# Patient Record
Sex: Female | Born: 1990 | Race: White | Hispanic: No | Marital: Single | Smoking: Current every day smoker
Health system: Southern US, Community
[De-identification: ages and names within clinical notes are randomized; demographics above are authoritative.]

## PROBLEM LIST (undated history)

## (undated) DIAGNOSIS — R569 Unspecified convulsions: Secondary | ICD-10-CM

## (undated) HISTORY — PX: WISDOM TOOTH EXTRACTION: SHX21

## (undated) HISTORY — PX: ADENOIDECTOMY: SUR15

## (undated) HISTORY — DX: Unspecified convulsions: R56.9

---
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
## 2011-02-13 LAB — CBC
HCT: 32.1 % — ABNORMAL LOW (ref 36.0–46.0)
Hemoglobin: 11.2 g/dL — ABNORMAL LOW (ref 12.0–15.0)
MCHC: 34.9 g/dL (ref 30.0–36.0)
MCHC: 35.4 g/dL (ref 30.0–36.0)
MCV: 94.1 fL (ref 78.0–100.0)
Platelets: 115 10*3/uL — ABNORMAL LOW (ref 150–400)
RDW: 12.6 % (ref 11.5–15.5)
RDW: 13 % (ref 11.5–15.5)

## 2011-02-13 LAB — RH IMMUNE GLOB WKUP(>/=20WKS)(NOT WOMEN'S HOSP)

## 2015-01-04 ENCOUNTER — Other Ambulatory Visit: Payer: Self-pay | Admitting: Advanced Practice Midwife

## 2016-03-02 ENCOUNTER — Other Ambulatory Visit: Payer: Self-pay | Admitting: Advanced Practice Midwife

## 2016-05-08 ENCOUNTER — Encounter (HOSPITAL_COMMUNITY): Payer: Self-pay | Admitting: Emergency Medicine

## 2016-05-08 ENCOUNTER — Emergency Department (HOSPITAL_COMMUNITY)
Admission: EM | Admit: 2016-05-08 | Discharge: 2016-05-08 | Disposition: A | Discharge status: Home / Self Care | Payer: Medicaid Other | Attending: Emergency Medicine | Admitting: Emergency Medicine

## 2016-05-08 DIAGNOSIS — K047 Periapical abscess without sinus: Secondary | ICD-10-CM | POA: Diagnosis not present

## 2016-05-08 DIAGNOSIS — F17200 Nicotine dependence, unspecified, uncomplicated: Secondary | ICD-10-CM | POA: Diagnosis not present

## 2016-05-08 DIAGNOSIS — Z79899 Other long term (current) drug therapy: Secondary | ICD-10-CM | POA: Diagnosis not present

## 2016-05-08 MED ORDER — HYDROCODONE-ACETAMINOPHEN 5-325 MG PO TABS
1.0000 | ORAL_TABLET | Freq: Once | ORAL | Status: AC
  Administered 2016-05-08: 1 via ORAL
  Filled 2016-05-08: qty 1

## 2016-05-08 MED ORDER — CLINDAMYCIN HCL 150 MG PO CAPS
300.0000 mg | ORAL_CAPSULE | Freq: Once | ORAL | Status: AC
  Administered 2016-05-08: 300 mg via ORAL
  Filled 2016-05-08: qty 2

## 2016-05-08 MED ORDER — CLINDAMYCIN HCL 300 MG PO CAPS: 300.0000 mg | ORAL_CAPSULE | Freq: Four times a day (QID) | ORAL | Status: DC

## 2016-05-08 MED ORDER — HYDROCODONE-ACETAMINOPHEN 5-325 MG PO TABS: ORAL_TABLET | ORAL | Status: DC

## 2016-05-08 NOTE — ED Notes (Signed)
Pt verbalized understanding of no driving and to use caution within 4 hours of taking pain meds due to meds cause drowsiness 

## 2016-05-08 NOTE — ED Notes (Addendum)
Patient complaining of lower left dental pain since May, worsening 1 1/2 weeks ago. States "I'm on my 4th round of augmentin and it's not helping." States she took 2 tramadol and 3 ibuprofen approximately 2 hours ago with no relief.

## 2016-05-08 NOTE — ED Provider Notes (Signed)
CSN: [id number]     Arrival date & time 05/08/16  1345 History   First MD Initiated Contact with Patient 05/08/16 1419     Chief Complaint  Patient presents with  . Dental Pain     (Consider location/radiation/quality/duration/timing/severity/associated sxs/prior Treatment) HPI   Allison Sandoval is a 25 y.o. female who presents to the Emergency Department complaining of dental pain and left facial swelling. Symptoms have been present for one month.  She has seen her dentist and has been taking augmentin without improvement.  Pain is worse with chewing and improves with application of a cold compress.  She denies fever, neck pain, difficulty swallowing.    History reviewed. No pertinent past medical history. History reviewed. No pertinent past surgical history. History reviewed. No pertinent family history. Social History  Substance Use Topics  . Smoking status: Current Every Day Smoker  . Smokeless tobacco: None  . Alcohol Use: No   OB History    No data available     Review of Systems  Constitutional: Negative for fever and appetite change.  HENT: Positive for dental problem. Negative for congestion, facial swelling, sore throat and trouble swallowing.   Eyes: Negative for pain and visual disturbance.  Musculoskeletal: Negative for neck pain and neck stiffness.  Neurological: Negative for dizziness, facial asymmetry and headaches.  Hematological: Negative for adenopathy.  All other systems reviewed and are negative.     Allergies  Review of patient's allergies indicates no known allergies.  Home Medications   Prior to Admission medications   Medication Sig Start Date End Date Taking? Authorizing Provider  amoxicillin-clavulanate (AUGMENTIN) 875-125 MG tablet Take 1 tablet by mouth 2 (two) times daily. Started 05/04/16 05/04/16  Yes Historical Provider, MD  ORSYTHIA 0.1-20 MG-MCG tablet Take 1 tablet by mouth daily. 03/30/16  Yes Historical Provider, MD   BP 116/79 mmHg   Pulse 103  Temp(Src) 98.9 F (37.2 C) (Oral)  Resp 14  Ht 5\' 3"  (1.6 m)  Wt 54.432 kg  BMI 21.26 kg/m2  SpO2 100%  LMP 04/29/2016 Physical Exam  Constitutional: She is oriented to person, place, and time. She appears well-developed and well-nourished. No distress.  HENT:  Head: Normocephalic and atraumatic.  Right Ear: Tympanic membrane and ear canal normal.  Left Ear: Tympanic membrane and ear canal normal.  Mouth/Throat: Uvula is midline, oropharynx is clear and moist and mucous membranes are normal. No trismus in the jaw. Dental caries present. No dental abscesses or uvula swelling.  Tenderness and dental caries of the left lower second molar.  Partial fracture of the tooth.  focal facial swelling of left jaw, No trismus, or sublingual abnml.    Neck: Normal range of motion. Neck supple.  Cardiovascular: Normal rate, regular rhythm and normal heart sounds.   No murmur heard. Pulmonary/Chest: Effort normal and breath sounds normal.  Musculoskeletal: Normal range of motion.  Lymphadenopathy:    She has no cervical adenopathy.  Neurological: She is alert and oriented to person, place, and time. She exhibits normal muscle tone. Coordination normal.  Skin: Skin is warm and dry.  Nursing note and vitals reviewed.   ED Course  Procedures (including critical care time) Labs Review Labs Reviewed - No data to display  Imaging Review No results found. I have personally reviewed and evaluated these images and lab results as part of my medical decision-making.   EKG Interpretation None      MDM   Final diagnoses:  Dental abscess    Patient  well appearing.  Non-toxic.  Localized left lower facial swelling w/o fluctuance of the gingiva.  She has f/u dental appt later this month.  No concerning sx's for ludwig's angina.  Appears stable for d/c    Pauline Ausammy Reece Fehnel, PA-C 05/09/16 2224  Zadie Rhineonald Wickline, MD 05/10/16 [phone]

## 2016-05-08 NOTE — Discharge Instructions (Signed)
Dental Abscess A dental abscess is pus in or around a tooth. HOME CARE  Take medicines only as told by your dentist.  If you were prescribed antibiotic medicine, finish all of it even if you start to feel better.  Rinse your mouth (gargle) often with salt water.  Do not drive or use heavy machinery, like a lawn mower, while taking pain medicine.  Do not apply heat to the outside of your mouth.  Keep all follow-up visits as told by your dentist. This is important. GET HELP IF:  Your pain is worse, and medicine does not help. GET HELP RIGHT AWAY IF:  You have a fever or chills.  Your symptoms suddenly get worse.  You have a very bad headache.  You have problems breathing or swallowing.  You have trouble opening your mouth.  You have puffiness (swelling) in your neck or around your eye.   This information is not intended to replace advice given to you by your health care provider. Make sure you discuss any questions you have with your health care provider.   Document Released: 03/07/2015 Document Reviewed: 03/07/2015 Elsevier Interactive Patient Education 2016 [employer]  

## 2016-10-18 ENCOUNTER — Emergency Department (HOSPITAL_COMMUNITY)
Admission: EM | Admit: 2016-10-18 | Discharge: 2016-10-18 | Disposition: A | Discharge status: Home / Self Care | Payer: Medicaid Other | Attending: Emergency Medicine | Admitting: Emergency Medicine

## 2016-10-18 ENCOUNTER — Emergency Department (HOSPITAL_COMMUNITY): Payer: Medicaid Other

## 2016-10-18 ENCOUNTER — Encounter (HOSPITAL_COMMUNITY): Payer: Self-pay | Admitting: Emergency Medicine

## 2016-10-18 DIAGNOSIS — Y929 Unspecified place or not applicable: Secondary | ICD-10-CM | POA: Insufficient documentation

## 2016-10-18 DIAGNOSIS — S50312A Abrasion of left elbow, initial encounter: Secondary | ICD-10-CM | POA: Diagnosis not present

## 2016-10-18 DIAGNOSIS — S0990XA Unspecified injury of head, initial encounter: Secondary | ICD-10-CM | POA: Diagnosis present

## 2016-10-18 DIAGNOSIS — S50311A Abrasion of right elbow, initial encounter: Secondary | ICD-10-CM | POA: Insufficient documentation

## 2016-10-18 DIAGNOSIS — Y999 Unspecified external cause status: Secondary | ICD-10-CM | POA: Diagnosis not present

## 2016-10-18 DIAGNOSIS — R51 Headache: Secondary | ICD-10-CM

## 2016-10-18 DIAGNOSIS — R519 Headache, unspecified: Secondary | ICD-10-CM

## 2016-10-18 DIAGNOSIS — S80211A Abrasion, right knee, initial encounter: Secondary | ICD-10-CM | POA: Insufficient documentation

## 2016-10-18 DIAGNOSIS — S80212A Abrasion, left knee, initial encounter: Secondary | ICD-10-CM | POA: Diagnosis not present

## 2016-10-18 DIAGNOSIS — W1830XA Fall on same level, unspecified, initial encounter: Secondary | ICD-10-CM | POA: Insufficient documentation

## 2016-10-18 DIAGNOSIS — R569 Unspecified convulsions: Secondary | ICD-10-CM | POA: Diagnosis not present

## 2016-10-18 DIAGNOSIS — Y939 Activity, unspecified: Secondary | ICD-10-CM | POA: Insufficient documentation

## 2016-10-18 DIAGNOSIS — S0081XA Abrasion of other part of head, initial encounter: Secondary | ICD-10-CM | POA: Diagnosis not present

## 2016-10-18 LAB — CBC
HEMATOCRIT: 43.6 % (ref 36.0–46.0)
HEMOGLOBIN: 14.9 g/dL (ref 12.0–15.0)
MCH: 32 pg (ref 26.0–34.0)
MCHC: 34.2 g/dL (ref 30.0–36.0)
MCV: 93.6 fL (ref 78.0–100.0)
Platelets: 175 10*3/uL (ref 150–400)
RBC: 4.66 MIL/uL (ref 3.87–5.11)
RDW: 12.8 % (ref 11.5–15.5)
WBC: 10.4 10*3/uL (ref 4.0–10.5)

## 2016-10-18 LAB — BASIC METABOLIC PANEL
ANION GAP: 9 (ref 5–15)
BUN: 9 mg/dL (ref 6–20)
CHLORIDE: 108 mmol/L (ref 101–111)
CO2: 22 mmol/L (ref 22–32)
Calcium: 9.5 mg/dL (ref 8.9–10.3)
Creatinine, Ser: 0.86 mg/dL (ref 0.44–1.00)
GFR calc non Af Amer: >60 mL/min (ref >60)
Glucose, Bld: 100 mg/dL — ABNORMAL HIGH (ref 65–99)
POTASSIUM: 3.8 mmol/L (ref 3.5–5.1)
Sodium: 139 mmol/L (ref 135–145)

## 2016-10-18 LAB — I-STAT BETA HCG BLOOD, ED (MC, WL, AP ONLY): I-stat hCG, quantitative: <5 m[IU]/mL (ref <5)

## 2016-10-18 MED ORDER — ACETAMINOPHEN 325 MG PO TABS
650.0000 mg | ORAL_TABLET | Freq: Once | ORAL | Status: AC
  Administered 2016-10-18: 650 mg via ORAL
  Filled 2016-10-18: qty 2

## 2016-10-18 NOTE — ED Triage Notes (Addendum)
Pt via [facility] with c/o new onset seizures.  Pt's friend witnessed pt take several steps before falling forward, landing on her chin and having two 30 second seizures with approx 15 seconds between them.  Pt was posictal on EMS arrival and confused.  Pt has abrasions and small cuts to left upper lip, chin, bilateral elbows, knuckles, and knees.  Pt does not have a hx of the same.  Pt reports not feeling well today with nausea but denies any other symptoms.  NAD, A&Ox4, ambulatory from EMS stretcher to room.

## 2016-10-18 NOTE — Discharge Instructions (Signed)
Your CT was normal today. I have given [facility] a [facility]. Please call for an appointment. I have also given a [facility] to establish primary care with. This is likely a new onset seizure. You need to follow-up with neurology. You may take Tylenol and ibuprofen for pain. Please do not drive given that this is a first seizure for you. Please return to the ED if he develop any worsening symptoms or develop another seizure or fever, or vision changes.

## 2016-10-18 NOTE — ED Provider Notes (Signed)
[facility] DEPT Provider Note   CSN: [id number] Arrival date & time: 10/18/16  4098     History   Chief Complaint Chief Complaint  Patient presents with  . Seizures    HPI Allison Sandoval is a 25 y.o. female.  25 year old Caucasian female with a past medical history with no significant past medical history presents to the ED today by EMS for new onset seizure witnessed by friend. Patient's friend witnessed patient takes several steps before falling and landing on her chin on concrete. States that she had to 2 30 sec episodes of "muscle tensing and shaking" with approximately 15 seconds between the 2 episodes. Friends states her eye were open during the event. Patient endorses loc. She does not remember the event. She did bite her tongue but denies any loss of bowel or bladder. Patient denies any history of seizures. EMS was called and patient was postictal on arrival confused. Patient states that she has not been feeling well today prior to the episode. States that she has had poor by mouth intake since yesterday morning. States that she is nauseated but has not vomited. Patient recently separated from her fianc and moved out of his house. She is living with her parents and her 28-year-old son. States that she has been on Xanax for anxiety but has not taking it for the past 5 days. Patient has been ambulatory since the event. She does have several cuts and abrasions noted to her chin, bilateral elbows, knuckles, knees. Patient's only complaint is of her right jaw being sore. Patient has history of TMJ but states her right jaw feels sore from the fall. Patient denies any fever, chills, headache, vision changes, lightheadedness, dizziness, chest pain, palpitations, shortness of breath, abdominal pain, nausea, emesis, urinary symptoms, change in bowel habits.      History reviewed. No pertinent past medical history.  There are no active problems to display for this patient.   Past  Surgical History:  Procedure Laterality Date  . ADENOIDECTOMY    . WISDOM TOOTH EXTRACTION      OB History    No data available       Home Medications    Prior to Admission medications   Medication Sig Start Date End Date Taking? Authorizing Provider  DiphenhydrAMINE HCl, Sleep, (SLEEP AID) 25 MG CAPS Take 1 capsule by mouth at bedtime.   Yes Historical Provider, MD  ORSYTHIA 0.1-20 MG-MCG tablet Take 1 tablet by mouth daily. 03/30/16  Yes Historical Provider, MD  clindamycin (CLEOCIN) 300 MG capsule Take 1 capsule (300 mg total) by mouth 4 (four) times daily. For 7 days Patient not taking: Reported on 10/18/2016 05/08/16   Pauline Aus, PA-C  HYDROcodone-acetaminophen (NORCO/VICODIN) 5-325 MG tablet Take one tab po q 4-6 hrs prn pain Patient not taking: Reported on 10/18/2016 05/08/16   Pauline Aus, PA-C    Family History History reviewed. No pertinent family history.  Social History Social History  Substance Use Topics  . Smoking status: Current Every Day Smoker    Packs/day: 1.00    Types: Cigarettes  . Smokeless tobacco: Never Used  . Alcohol use Yes     Comment: occasionally     Allergies   Patient has no known allergies.   Review of Systems Review of Systems  Constitutional: Positive for fatigue. Negative for chills and fever.  HENT: Negative for ear pain and sore throat.   Eyes: Negative for pain and visual disturbance.  Respiratory: Negative for cough and shortness of  breath.   Cardiovascular: Negative for chest pain and palpitations.  Gastrointestinal: Negative for abdominal pain and vomiting.  Genitourinary: Negative for dysuria and hematuria.  Musculoskeletal: Negative for arthralgias, back pain, neck pain and neck stiffness.  Skin: Positive for wound. Negative for color change and rash.  Neurological: Positive for seizures. Negative for dizziness, syncope, weakness, light-headedness, numbness and headaches.  All other systems reviewed and are  negative.    Physical Exam Updated Vital Signs BP 101/61   Pulse 90   Temp 98.4 F (36.9 C) (Oral)   Resp 12   Ht 5\' 3"  (1.6 m)   Wt 49.9 kg   LMP 10/04/2016 (Approximate)   SpO2 98%   BMI 19.49 kg/m   Physical Exam  Constitutional: She is oriented to person, place, and time. She appears well-developed and well-nourished. No distress.  Patient is awake and alert 4. She has mild confusion and what happened prior to the event. However she has normal memory after the event.  HENT:  Head: Normocephalic and atraumatic. Head is without raccoon's eyes, without Battle's sign and without contusion.  Right Ear: Tympanic membrane, external ear and ear canal normal.  Left Ear: Tympanic membrane, external ear and ear canal normal.  Nose: Nose normal.  Mouth/Throat: Uvula is midline, oropharynx is clear and moist and mucous membranes are normal.  Small abrasion noted to the right lower chin. Bleeding is controlled. Patient did bite her tongue. No signs of laceration. No active bleeding. Patient without any trismus. Her when she opens her jaw she feels pain on the condylar process. Patient states that her jaw always pops when she moves and this is normal for her.  Eyes: Conjunctivae and EOM are normal. Pupils are equal, round, and reactive to light. Right eye exhibits no discharge. Left eye exhibits no discharge. No scleral icterus.  Neck: Normal range of motion. Neck supple. No thyromegaly present.  Cardiovascular: Normal rate, regular rhythm, normal heart sounds and intact distal pulses.  Exam reveals no gallop and no friction rub.   No murmur heard. Pulmonary/Chest: Effort normal and breath sounds normal. No respiratory distress. She exhibits no tenderness.  No signs of contusions or ecchymosis.  Abdominal: Soft. Bowel sounds are normal. She exhibits no distension. There is no tenderness. There is no rebound and no guarding.  Musculoskeletal: Normal range of motion. She exhibits no tenderness  or deformity.  No paint with palpation of the bilateral knee or elbows. Patient with abrasions to her bilateral knees. Bleeding is controlled. She also has abrasions to her bilateral elbows. Bleeding is controlled. She has full range of motion of all extremities. No crepitus, deformity, ecchymosis, erythema noted to any joints. Her DP pulses are 2+ bilaterally. Radial pulses are 2+ bilaterally. Sensation intact. Cap refill normal throughout all extremities.  Lymphadenopathy:    She has no cervical adenopathy.  Neurological: She is alert and oriented to person, place, and time.  The patient is alert, attentive, and oriented x 3. Speech is clear. Cranial nerve II-VII grossly intact. Negative pronator drift. Sensation intact. Strength 5/5 in all extremities. Reflexes 2+ and symmetric at biceps, triceps, knees, and ankles. Rapid alternating movement and fine finger movements intact. Romberg is absent. Posture and gait normal.   Skin: Skin is warm and dry. Capillary refill takes less than 2 seconds.  Nursing note and vitals reviewed.    ED Treatments / Results  Labs (all labs ordered are listed, but only abnormal results are displayed) Labs Reviewed  BASIC METABOLIC PANEL -  Abnormal; Notable for the following:       Result Value   Glucose, Bld 100 (*)    All other components within normal limits  CBC  I-STAT BETA HCG BLOOD, ED ([facility], [facility], [facility] ONLY)  CBG MONITORING, ED    EKG  EKG Interpretation None       Radiology Ct Head Wo Contrast  Result Date: 10/18/2016 CLINICAL DATA:  Seizure with fall EXAM: CT HEAD WITHOUT CONTRAST CT MAXILLOFACIAL WITHOUT CONTRAST CT CERVICAL SPINE WITHOUT CONTRAST TECHNIQUE: Multidetector CT imaging of the head, cervical spine, and maxillofacial structures were performed using the standard protocol without intravenous contrast. Multiplanar CT image reconstructions of the cervical spine and maxillofacial structures were also generated. COMPARISON:  None.  FINDINGS: CT HEAD FINDINGS Brain: The ventricles are normal in size and configuration. There is no intracranial mass, hemorrhage, extra-axial fluid collection, or midline shift. Gray-white compartments are normal. No acute infarct evident. Vascular: No hyperdense vessel.  No evident vascular calcifications. Skull: Bony calvarium appears intact. Other: Mastoid air cells are clear. There is debris in each external auditory canal. CT MAXILLOFACIAL FINDINGS Osseous: There is no fracture or dislocation. No blastic or lytic bone lesions are evident. Orbits: Orbits appear symmetric bilaterally. No intraorbital lesions are evident. Sinuses: There is opacification in the posterior aspect of the left sphenoid sinus. Other paranasal sinuses are clear. The ostiomeatal unit complexes are patent bilaterally. There is no nares obstruction. The nasal septum appears midline. Soft tissues: No soft tissue mass or abscess. Salivary glands appear symmetric and normal bilaterally. No adenopathy. Visualized pharynx is normal. Tongue and tongue base regions appear normal. CT CERVICAL SPINE FINDINGS Alignment: There is no spondylolisthesis. Skull base and vertebrae: The craniocervical junction and skull base regions appear normal. There is no apparent fracture. No blastic or lytic bone lesions. Soft tissues and spinal canal: Prevertebral soft tissues and predental space regions are normal. No paraspinous lesions. No spinal stenosis. Disc levels: Disc spaces appear normal. No nerve root edema or effacement. No disc extrusion or stenosis. Upper chest: Visualized upper lobe regions appear normal. Other: None IMPRESSION: CT head: No intracranial mass, hemorrhage, or extra-axial fluid collection. Gray-white compartments are normal. Debris is noted in each external auditory canal. CT maxillofacial: Left sphenoid sinus disease. No fracture or dislocation. Study otherwise unremarkable. CT cervical spine: No fracture or spondylolisthesis. No evident  arthropathy. Electronically Signed   By: Bretta Bang III M.D.   On: 10/18/2016 11:39   Ct Cervical Spine Wo Contrast  Result Date: 10/18/2016 CLINICAL DATA:  Seizure with fall EXAM: CT HEAD WITHOUT CONTRAST CT MAXILLOFACIAL WITHOUT CONTRAST CT CERVICAL SPINE WITHOUT CONTRAST TECHNIQUE: Multidetector CT imaging of the head, cervical spine, and maxillofacial structures were performed using the standard protocol without intravenous contrast. Multiplanar CT image reconstructions of the cervical spine and maxillofacial structures were also generated. COMPARISON:  None. FINDINGS: CT HEAD FINDINGS Brain: The ventricles are normal in size and configuration. There is no intracranial mass, hemorrhage, extra-axial fluid collection, or midline shift. Gray-white compartments are normal. No acute infarct evident. Vascular: No hyperdense vessel.  No evident vascular calcifications. Skull: Bony calvarium appears intact. Other: Mastoid air cells are clear. There is debris in each external auditory canal. CT MAXILLOFACIAL FINDINGS Osseous: There is no fracture or dislocation. No blastic or lytic bone lesions are evident. Orbits: Orbits appear symmetric bilaterally. No intraorbital lesions are evident. Sinuses: There is opacification in the posterior aspect of the left sphenoid sinus. Other paranasal sinuses are clear. The ostiomeatal unit complexes  are patent bilaterally. There is no nares obstruction. The nasal septum appears midline. Soft tissues: No soft tissue mass or abscess. Salivary glands appear symmetric and normal bilaterally. No adenopathy. Visualized pharynx is normal. Tongue and tongue base regions appear normal. CT CERVICAL SPINE FINDINGS Alignment: There is no spondylolisthesis. Skull base and vertebrae: The craniocervical junction and skull base regions appear normal. There is no apparent fracture. No blastic or lytic bone lesions. Soft tissues and spinal canal: Prevertebral soft tissues and predental space  regions are normal. No paraspinous lesions. No spinal stenosis. Disc levels: Disc spaces appear normal. No nerve root edema or effacement. No disc extrusion or stenosis. Upper chest: Visualized upper lobe regions appear normal. Other: None IMPRESSION: CT head: No intracranial mass, hemorrhage, or extra-axial fluid collection. Gray-white compartments are normal. Debris is noted in each external auditory canal. CT maxillofacial: Left sphenoid sinus disease. No fracture or dislocation. Study otherwise unremarkable. CT cervical spine: No fracture or spondylolisthesis. No evident arthropathy. Electronically Signed   By: Bretta BangWilliam  Woodruff III M.D.   On: 10/18/2016 11:39   Ct Maxillofacial Wo Cm  Result Date: 10/18/2016 CLINICAL DATA:  Seizure with fall EXAM: CT HEAD WITHOUT CONTRAST CT MAXILLOFACIAL WITHOUT CONTRAST CT CERVICAL SPINE WITHOUT CONTRAST TECHNIQUE: Multidetector CT imaging of the head, cervical spine, and maxillofacial structures were performed using the standard protocol without intravenous contrast. Multiplanar CT image reconstructions of the cervical spine and maxillofacial structures were also generated. COMPARISON:  None. FINDINGS: CT HEAD FINDINGS Brain: The ventricles are normal in size and configuration. There is no intracranial mass, hemorrhage, extra-axial fluid collection, or midline shift. Gray-white compartments are normal. No acute infarct evident. Vascular: No hyperdense vessel.  No evident vascular calcifications. Skull: Bony calvarium appears intact. Other: Mastoid air cells are clear. There is debris in each external auditory canal. CT MAXILLOFACIAL FINDINGS Osseous: There is no fracture or dislocation. No blastic or lytic bone lesions are evident. Orbits: Orbits appear symmetric bilaterally. No intraorbital lesions are evident. Sinuses: There is opacification in the posterior aspect of the left sphenoid sinus. Other paranasal sinuses are clear. The ostiomeatal unit complexes are patent  bilaterally. There is no nares obstruction. The nasal septum appears midline. Soft tissues: No soft tissue mass or abscess. Salivary glands appear symmetric and normal bilaterally. No adenopathy. Visualized pharynx is normal. Tongue and tongue base regions appear normal. CT CERVICAL SPINE FINDINGS Alignment: There is no spondylolisthesis. Skull base and vertebrae: The craniocervical junction and skull base regions appear normal. There is no apparent fracture. No blastic or lytic bone lesions. Soft tissues and spinal canal: Prevertebral soft tissues and predental space regions are normal. No paraspinous lesions. No spinal stenosis. Disc levels: Disc spaces appear normal. No nerve root edema or effacement. No disc extrusion or stenosis. Upper chest: Visualized upper lobe regions appear normal. Other: None IMPRESSION: CT head: No intracranial mass, hemorrhage, or extra-axial fluid collection. Gray-white compartments are normal. Debris is noted in each external auditory canal. CT maxillofacial: Left sphenoid sinus disease. No fracture or dislocation. Study otherwise unremarkable. CT cervical spine: No fracture or spondylolisthesis. No evident arthropathy. Electronically Signed   By: Bretta BangWilliam  Woodruff III M.D.   On: 10/18/2016 11:39    Procedures Procedures (including critical care time)  Medications Ordered in ED Medications  acetaminophen (TYLENOL) tablet 650 mg (650 mg Oral Given 10/18/16 1151)     Initial Impression / Assessment and Plan / ED Course  I have reviewed the triage vital signs and the nursing notes.  Pertinent labs &  imaging results that were available during my care of the patient were reviewed by me and considered in my medical decision making (see chart for details).  Clinical Course   Patient with no evidence of focal neuro deficits on physical exam and is at mental baseline.  Labs and imaging have been reviewed. She has been monitored for 3 hours without any new seizure. Patient is  advised to followup with neurologist in regards to today's event.  Wounds were clean and dressed. No imagine required given no pain. Spoke with patient and family in detail about driving restrictions until cleared by a neurologist.  Patient verbalizes understanding. Answered all questions.  Patient is hemodynamically stable and in no acute distress prior to discharge. Patient felt safe for discharge. Mother is at bedside and states they will follow up with neurologist. She is able to ambulate without any abnormal gait. Patient discussed with Dr. Deretha Emory who agrees with the above plan.    Final Clinical Impressions(s) / ED Diagnoses   Final diagnoses:  Seizure-like activity (HCC)  Nonintractable headache, unspecified chronicity pattern, unspecified headache type    New Prescriptions New Prescriptions   No medications on file     Rise Mu, PA-C 10/18/16 1228    Vanetta Mulders, MD 10/19/16 2322

## 2016-10-23 ENCOUNTER — Ambulatory Visit (INDEPENDENT_AMBULATORY_CARE_PROVIDER_SITE_OTHER): Payer: Medicaid Other | Admitting: Neurology

## 2016-10-23 ENCOUNTER — Encounter: Payer: Self-pay | Admitting: Neurology

## 2016-10-23 DIAGNOSIS — R569 Unspecified convulsions: Secondary | ICD-10-CM | POA: Diagnosis not present

## 2016-10-23 NOTE — Patient Instructions (Signed)
No driving until seizure-free for 6 months 

## 2016-10-23 NOTE — Progress Notes (Signed)
PATIENT: Allison Sandoval DOB: January 09, 1991  Chief Complaint  Patient presents with  . Seizure-like event    She is here with her mother, Jenel LucksRoberta.  States a friend witnessed a seizure-like event on 10/18/16.  She fell to the ground with muscles tense and shaking.  No loss of bowel or bladder.  She had a bitten tongue and was nauseated afterwards.  The event lasted approximately 20 seconds.  She does not remember it happening and was taken to the ED for evaluation.    Marland Kitchen. PCP    No PCP -  referred by ED     HISTORICAL  Allison DuMegan L Fleig is a 25 years old right-handed female, seen in refer by emergency room for evaluation of seizure, initial evaluation was October 23 2016.  She has no previous history of seizure, on October 18 2016, around 8 AM, when she stepped out of her car, she felt lightheadedness, then lost consciousness on the ground, per witness, she fell on her left knee, bruised her chin, there was described generalized tonic-clonic activity, post event confusion, EMS was called,  I personally reviewed CAT scan of the brain, cervical spine, there was no significant abnormality, laboratory evaluation showed normal CBC, BMP,  REVIEW OF SYSTEMS: Full 14 system review of systems performed and notable only for: as above  ALLERGIES: No Known Allergies  HOME MEDICATIONS: Current Outpatient Prescriptions  Medication Sig Dispense Refill  . ORSYTHIA 0.1-20 MG-MCG tablet Take 1 tablet by mouth daily.  3   No current facility-administered medications for this visit.     PAST MEDICAL HISTORY: History reviewed. No pertinent past medical history.  PAST SURGICAL HISTORY: Past Surgical History:  Procedure Laterality Date  . ADENOIDECTOMY    . WISDOM TOOTH EXTRACTION      FAMILY HISTORY: Family History  Problem Relation Age of Onset  . Hypertension Mother   . Healthy Father     SOCIAL HISTORY:  Social History   Social History  . Marital status: Single    Spouse name:  N/A  . Number of children: 1  . Years of education: HS   Occupational History  . Housekeeper    Social History Main Topics  . Smoking status: Current Every Day Smoker    Packs/day: 1.00    Types: Cigarettes  . Smokeless tobacco: Never Used  . Alcohol use Yes     Comment: occasionally  . Drug use: No  . Sexual activity: Not on file   Other Topics Concern  . Not on file   Social History Narrative   Lives at home with her fiance and daughter.   Right-handed.   2 cups caffeine per day.     PHYSICAL EXAM   Vitals:   10/23/16 0752  BP: 92/60  Pulse: 84  Weight: 116 lb (52.6 kg)  Height: 5\' 3"  (1.6 m)    Not recorded      Body mass index is 20.55 kg/m.  PHYSICAL EXAMNIATION:  Gen: NAD, conversant, well nourised, obese, well groomed                     Cardiovascular: Regular rate rhythm, no peripheral edema, warm, nontender. Eyes: Conjunctivae clear without exudates or hemorrhage Neck: Supple, no carotid bruits. Pulmonary: Clear to auscultation bilaterally   NEUROLOGICAL EXAM:  MENTAL STATUS: Speech:    Speech is normal; fluent and spontaneous with normal comprehension.  Cognition:     Orientation to time, place and person  Normal recent and remote memory     Normal Attention span and concentration     Normal Language, naming, repeating,spontaneous speech     Fund of knowledge   CRANIAL NERVES: CN II: Visual fields are full to confrontation. Fundoscopic exam is normal with sharp discs and no vascular changes. Pupils are round equal and briskly reactive to light. CN III, IV, VI: extraocular movement are normal. No ptosis. CN V: Facial sensation is intact to pinprick in all 3 divisions bilaterally. Corneal responses are intact.  CN VII: Face is symmetric with normal eye closure and smile. CN VIII: Hearing is normal to rubbing fingers CN IX, X: Palate elevates symmetrically. Phonation is normal. CN XI: Head turning and shoulder shrug are intact CN XII:  Tongue is midline with normal movements and no atrophy.  MOTOR: There is no pronator drift of out-stretched arms. Muscle bulk and tone are normal. Muscle strength is normal.  REFLEXES: Reflexes are 2+ and symmetric at the biceps, triceps, knees, and ankles. Plantar responses are flexor.  SENSORY: Intact to light touch, pinprick, positional sensation and vibratory sensation are intact in fingers and toes.  COORDINATION: Rapid alternating movements and fine finger movements are intact. There is no dysmetria on finger-to-nose and heel-knee-shin.    GAIT/STANCE: Posture is normal. Gait is steady with normal steps, base, arm swing, and turning. Heel and toe walking are normal. Tandem gait is normal.  Romberg is absent.   DIAGNOSTIC DATA (LABS, IMAGING, TESTING) - I reviewed patient records, labs, notes, testing and imaging myself where available.   ASSESSMENT AND PLAN  Allison DuMegan L Dura is a 25 y.o. female   Generalized seizure on Dec 15th 2017  Complete evaluation with MRI brain w/wo  EEG  No driving until seizure in 6 months  No AED now,  Call clinic for recurrent event   Levert FeinsteinYijun Dakayla Disanti, M.D. Ph.D.  [facility] [address], [address] [place], [place] [address] Ph: [phone] Fax: [phone]  CC: Referring Provider

## 2016-12-11 ENCOUNTER — Ambulatory Visit
Admission: RE | Admit: 2016-12-11 | Discharge: 2016-12-11 | Disposition: A | Discharge status: Home / Self Care | Payer: Medicaid Other | Source: Ambulatory Visit | Attending: Neurology | Admitting: Neurology

## 2016-12-11 DIAGNOSIS — R569 Unspecified convulsions: Secondary | ICD-10-CM

## 2016-12-11 MED ORDER — GADOBENATE DIMEGLUMINE 529 MG/ML IV SOLN
10.0000 mL | Freq: Once | INTRAVENOUS | Status: AC | PRN
  Administered 2016-12-11: 10 mL via INTRAVENOUS

## 2016-12-17 ENCOUNTER — Other Ambulatory Visit: Payer: Medicaid Other

## 2017-03-25 DIAGNOSIS — H40033 Anatomical narrow angle, bilateral: Secondary | ICD-10-CM | POA: Diagnosis not present

## 2017-03-25 DIAGNOSIS — H16223 Keratoconjunctivitis sicca, not specified as Sjogren's, bilateral: Secondary | ICD-10-CM | POA: Diagnosis not present

## 2017-11-17 ENCOUNTER — Telehealth: Payer: Self-pay | Admitting: Neurology

## 2017-11-17 NOTE — Telephone Encounter (Signed)
Allison Sandoval's mother calling, Allison Sandoval had a seizure on 11/14/17. She is Dr Terrace ArabiaYan Allison Sandoval, [facility]15 [facility] 2:00. Allison Sandoval last seen 10/23/16.  FYI

## 2017-11-18 ENCOUNTER — Encounter: Payer: Self-pay | Admitting: Adult Health

## 2017-11-18 ENCOUNTER — Telehealth: Payer: Self-pay | Admitting: Adult Health

## 2017-11-18 ENCOUNTER — Encounter (INDEPENDENT_AMBULATORY_CARE_PROVIDER_SITE_OTHER): Payer: Self-pay

## 2017-11-18 ENCOUNTER — Ambulatory Visit (INDEPENDENT_AMBULATORY_CARE_PROVIDER_SITE_OTHER): Payer: Self-pay | Admitting: Adult Health

## 2017-11-18 VITALS — BP 112/70 | HR 86 | Wt 127.0 lb

## 2017-11-18 DIAGNOSIS — R569 Unspecified convulsions: Secondary | ICD-10-CM

## 2017-11-18 MED ORDER — LAMOTRIGINE 25 MG PO TABS: ORAL_TABLET | ORAL | 3 refills | Status: DC

## 2017-11-18 NOTE — Progress Notes (Signed)
PATIENT: Allison Sandoval DOB: 03/10/1991  REASON FOR VISIT: follow up-seizure HISTORY FROM: patient, her boyfriend and mother  HISTORY OF PRESENT ILLNESS: Today 11/18/17 Allison Sandoval is a 27 year old female with a history of a seizure event in December 2017.  She returns today for an evaluation.  The patient reports that after her visit in December 2017 with Dr. Terrace Arabia she had an additional seizure about 9 days later.  She reports that she was seizure-free for an entire year then had another seizure this past weekend.  She states that each seizure event seems to be related to her reducing or stopping her dose of Xanax.  Her first seizure she stopped abruptly and then had a seizure 5 days later.  The most recent seizure she decreased her dose and then had a seizure.  She advises that Xanax is not prescribed but rather she is obtaining it by other means.  She did have an MRI of the brain in December 2017 that was relatively unremarkable.  She however never had an EEG.  Her boyfriend who witnessed her seizure events states that she was convulsing in all 4 extremities.  She reports that she did not bite her tongue.  But denies loss of bowels or bladder.  Patient does reports that she smokes marijuana daily but denies any other recreational drug use.  She returns today for an evaluation. HISTORY   REVIEW OF SYSTEMS: Out of a complete 14 system review of symptoms, the patient complains only of the following symptoms, and all other reviewed systems are negative.  Dizziness, seizures, moles  ALLERGIES: No Known Allergies  HOME MEDICATIONS: Outpatient Medications Prior to Visit  Medication Sig Dispense Refill  . ORSYTHIA 0.1-20 MG-MCG tablet Take 1 tablet by mouth daily.  3   No facility-administered medications prior to visit.     PAST MEDICAL HISTORY: Past Medical History:  Diagnosis Date  . Seizures (HCC)    most recent 11/15/17    PAST SURGICAL HISTORY: Past Surgical History:    Procedure Laterality Date  . ADENOIDECTOMY    . WISDOM TOOTH EXTRACTION      FAMILY HISTORY: Family History  Problem Relation Age of Onset  . Hypertension Mother   . Healthy Father     SOCIAL HISTORY: Social History   Socioeconomic History  . Marital status: Single    Spouse name: Not on file  . Number of children: 1  . Years of education: HS  . Highest education level: Not on file  Social Needs  . Financial resource strain: Not on file  . Food insecurity - worry: Not on file  . Food insecurity - inability: Not on file  . Transportation needs - medical: Not on file  . Transportation needs - non-medical: Not on file  Occupational History  . Occupation: Housekeeper  Tobacco Use  . Smoking status: Current Every Day Smoker    Packs/day: 1.00    Types: Cigarettes  . Smokeless tobacco: Never Used  . Tobacco comment: 11/18/17 1/2 ppd  Substance and Sexual Activity  . Alcohol use: Yes    Comment: occasionally  . Drug use: No  . Sexual activity: Not on file  Other Topics Concern  . Not on file  Social History Narrative   Lives at home with her fiance and daughter.   Right-handed.   2 cups caffeine per day.      PHYSICAL EXAM  Vitals:   11/18/17 1330  BP: 112/70  Pulse: 86  Weight: 127  lb (57.6 kg)   Body mass index is 22.5 kg/m.  Generalized: Well developed, in no acute distress   Neurological examination  Mentation: Alert oriented to time, place, history taking. Follows all commands speech and language fluent Cranial nerve II-XII: Pupils were equal round reactive to light. Extraocular movements were full, visual field were full on confrontational test. Facial sensation and strength were normal. Uvula tongue midline. Head turning and shoulder shrug  were normal and symmetric. Motor: The motor testing reveals 5 over 5 strength of all 4 extremities. Good symmetric motor tone is noted throughout.  Sensory: Sensory testing is intact to soft touch on all 4  extremities. No evidence of extinction is noted.  Coordination: Cerebellar testing reveals good finger-nose-finger and heel-to-shin bilaterally.  Gait and station: Gait is normal. Tandem gait is normal. Romberg is negative. No drift is seen.  Reflexes: Deep tendon reflexes are symmetric and normal bilaterally.   DIAGNOSTIC DATA (LABS, IMAGING, TESTING) - I reviewed patient records, labs, notes, testing and imaging myself where available.  Lab Results  Component Value Date   WBC 10.4 10/18/2016   HGB 14.9 10/18/2016   HCT 43.6 10/18/2016   MCV 93.6 10/18/2016   PLT 175 10/18/2016      Component Value Date/Time   NA 139 10/18/2016 1001   K 3.8 10/18/2016 1001   CL 108 10/18/2016 1001   CO2 22 10/18/2016 1001   GLUCOSE 100 (H) 10/18/2016 1001   BUN 9 10/18/2016 1001   CREATININE 0.86 10/18/2016 1001   CALCIUM 9.5 10/18/2016 1001   GFRNONAA >60 10/18/2016 1001   GFRAA >60 10/18/2016 1001      ASSESSMENT AND PLAN 27 y.o. year old female  has a past medical history of Seizures (HCC). here with :  1.  Seizures  The patient has had a total of 3 seizure events.  I discussed this patient's plan of care with Dr. Terrace ArabiaYan.  An EEG will be ordered for the patient.  We discussed starting medication and the patient is amenable to this plan.  She will be started on Lamictal 25 mg twice a day for 1 week then increasing to 50 mg twice a day for 1 week then increasing to 75 mg twice a day for 1 week and then 100 mg twice a day thereafter.  I have reviewed side effects of Lamictal with the patient and her family.  I advised patient that she should not stop taking Xanax abruptly.  Since this medication is not prescribed to her advised that she ideally should not be taking it.  Also advised against smoking marijuana.  Patient is advised that she should not operate a motor vehicle, participate in water sports or activities or perform activities at heights until she is seizure free for 6 months.  She voiced  understanding.  She will follow-up in 6 months or sooner if needed.    Butch PennyMegan Atoya Andrew, MSN, NP-C 11/18/2017, 1:44 PM [facility] [address], [address] [place], [place] [address] [phone]

## 2017-11-18 NOTE — Telephone Encounter (Signed)
Pts mother called wanting to know if and when pt has another seizure is she suppose to call 911 or call to let us know. Please call back to discuss and any further questions.

## 2017-11-18 NOTE — Patient Instructions (Addendum)
Your Plan:  Start Lamictal 25 mg:  WEEK 1: take 1 tablet twice a day WEEK 2: take 2 tablets twice a day  WEEK 3: take 3 tablets twice a day WEEK 4: take 4 tablets twice a day  At the beginning of week 4 please call and I will send you in the 100 mg tablets. You will then take 1 tablet twice a day  EEG ordered.   Please do not drive, operate heavy machinery, perform activities at heights or participate in water activities until 6 months seizure free]  Thank you for coming to see us at [facility]. I hope we have been able to provide you high quality care today.  You may receive a patient satisfaction survey over the next few weeks. We would appreciate your feedback and comments so that we may continue to improve ourselves and the health of our patients.

## 2017-11-19 ENCOUNTER — Telehealth: Payer: Self-pay | Admitting: Adult Health

## 2017-11-19 NOTE — Telephone Encounter (Signed)
Called patient's mother, on DPR and advised her that if her daughter has a seizure and her life is in danger, she has hit her head or other life threatening circumstances, she or whoever witnessed her seizure should call 911 immediately. Otheriwse she needs to be taken directly to the ED.  Mother repeated these directions correctly, verbalized understanding, appreciation of call back.

## 2017-11-19 NOTE — Progress Notes (Signed)
I have reviewed and agreed above plan. 

## 2017-11-19 NOTE — Telephone Encounter (Signed)
Patient's mother has a question if patient happens to Seizure  over the weekend should she take her daughter to the ER ?  Thalia PartyMary Clarie As RN will you call and relay mother works for [employer] she can be reached at [phone]. You can leave her a detailed message.   I relayed to her yes always take her to the ER.

## 2017-11-20 ENCOUNTER — Ambulatory Visit: Payer: Self-pay | Admitting: Neurology

## 2017-11-20 ENCOUNTER — Encounter: Payer: Self-pay | Admitting: Neurology

## 2017-11-20 DIAGNOSIS — R569 Unspecified convulsions: Secondary | ICD-10-CM

## 2017-11-21 NOTE — Procedures (Signed)
   HISTORY: 27 year old female with history of seizure  TECHNIQUE:  16 channel EEG was performed based on standard 10-16 international system. One channel was dedicated to EKG, which has demonstrates normal sinus rhythm of 72 beats per minutes.  Upon awakening, the posterior background activity was well-developed, in alpha range, reactive to eye opening and closure.  There was no evidence of epileptiform discharge.  There is frequent electrode artifact.  Photic stimulation was performed, which induced a symmetric photic driving.  Hyperventilation was performed, there was no abnormality elicit.  No sleep was achieved.  CONCLUSION: This is a  normal awake EEG.  There is no electrodiagnostic evidence of epileptiform discharge.  Levert FeinsteinYijun Nader Sandoval, M.D. Ph.D.  [facility] [address] [place], [place] [address] Phone: [phone] Fax:      [phone]

## 2017-11-24 ENCOUNTER — Telehealth: Payer: Self-pay | Admitting: *Deleted

## 2017-11-24 NOTE — Telephone Encounter (Signed)
LVM informing patient her EEG is normal.  Advised her this does not mean she never had a seizure or will never have another seizure; this was a short span of time. Advised she continue to take medication as ordered. Left number for any questions.

## 2017-12-09 MED ORDER — LAMOTRIGINE 100 MG PO TABS: 100.0000 mg | ORAL_TABLET | Freq: Two times a day (BID) | ORAL | 1 refills | Status: DC

## 2017-12-09 NOTE — Telephone Encounter (Signed)
LMVM for pt that did fax prescription for her for lamictal 100mg  po bid and to start after finishing  Her 4th week of 25mg  tabs.

## 2017-12-09 NOTE — Telephone Encounter (Signed)
Pt is stating that tomorrow will be 4 pills in the morning and 4 at night. Pt was told to call to let Aundra MilletMegan know once started. lamoTRIgine (LAMICTAL) 25 MG tablet. Please call back to discuss when the new prescription is going to be sent in for only 1 pill.

## 2017-12-09 NOTE — Telephone Encounter (Signed)
Fax confirmation received lamictal 100mg  po bid [facility] [phone].

## 2017-12-09 NOTE — Addendum Note (Signed)
Addended by: Guy BeginYOUNG, SANDRA S on: 12/09/2017 03:33 PM   Modules accepted: Orders

## 2017-12-28 ENCOUNTER — Other Ambulatory Visit: Payer: Self-pay

## 2017-12-28 ENCOUNTER — Emergency Department (HOSPITAL_COMMUNITY)
Admission: EM | Admit: 2017-12-28 | Discharge: 2017-12-29 | Disposition: A | Discharge status: Home / Self Care | Payer: Self-pay | Attending: Emergency Medicine | Admitting: Emergency Medicine

## 2017-12-28 ENCOUNTER — Encounter (HOSPITAL_COMMUNITY): Payer: Self-pay | Admitting: *Deleted

## 2017-12-28 DIAGNOSIS — K529 Noninfective gastroenteritis and colitis, unspecified: Secondary | ICD-10-CM | POA: Insufficient documentation

## 2017-12-28 DIAGNOSIS — F17210 Nicotine dependence, cigarettes, uncomplicated: Secondary | ICD-10-CM | POA: Insufficient documentation

## 2017-12-28 MED ORDER — SODIUM CHLORIDE 0.9 % IV BOLUS (SEPSIS)
1000.0000 mL | Freq: Once | INTRAVENOUS | Status: AC
  Administered 2017-12-29: 1000 mL via INTRAVENOUS

## 2017-12-28 MED ORDER — PROMETHAZINE HCL 25 MG/ML IJ SOLN
25.0000 mg | Freq: Once | INTRAMUSCULAR | Status: AC
  Administered 2017-12-29: 25 mg via INTRAVENOUS
  Filled 2017-12-28: qty 1

## 2017-12-28 NOTE — ED Triage Notes (Signed)
Pt c/o vomiting for the last 5 hours; pt has been around kids that have had the stomach bug; pt has taken zofran at 2300 tonight

## 2017-12-29 ENCOUNTER — Emergency Department (HOSPITAL_COMMUNITY): Payer: Self-pay

## 2017-12-29 LAB — COMPREHENSIVE METABOLIC PANEL
ALK PHOS: 56 U/L (ref 38–126)
ALT: 22 U/L (ref 14–54)
ANION GAP: 13 (ref 5–15)
AST: 22 U/L (ref 15–41)
Albumin: 5 g/dL (ref 3.5–5.0)
BILIRUBIN TOTAL: 0.7 mg/dL (ref 0.3–1.2)
BUN: 16 mg/dL (ref 6–20)
CO2: 18 mmol/L — AB (ref 22–32)
CREATININE: 0.93 mg/dL (ref 0.44–1.00)
Calcium: 10 mg/dL (ref 8.9–10.3)
Chloride: 110 mmol/L (ref 101–111)
Glucose, Bld: 136 mg/dL — ABNORMAL HIGH (ref 65–99)
Potassium: 3.8 mmol/L (ref 3.5–5.1)
SODIUM: 141 mmol/L (ref 135–145)
TOTAL PROTEIN: 8.8 g/dL — AB (ref 6.5–8.1)

## 2017-12-29 LAB — CBC WITH DIFFERENTIAL/PLATELET
Basophils Absolute: 0 10*3/uL (ref 0.0–0.1)
Basophils Relative: 0 %
Eosinophils Absolute: 0.1 10*3/uL (ref 0.0–0.7)
Eosinophils Relative: 1 %
HCT: 48.3 % — ABNORMAL HIGH (ref 36.0–46.0)
HEMOGLOBIN: 16 g/dL — AB (ref 12.0–15.0)
LYMPHS ABS: 1.7 10*3/uL (ref 0.7–4.0)
LYMPHS PCT: 9 %
MCH: 31.6 pg (ref 26.0–34.0)
MCHC: 33.1 g/dL (ref 30.0–36.0)
MCV: 95.3 fL (ref 78.0–100.0)
MONOS PCT: 0 %
Monocytes Absolute: 0 10*3/uL — ABNORMAL LOW (ref 0.1–1.0)
NEUTROS PCT: 90 %
Neutro Abs: 17.6 10*3/uL — ABNORMAL HIGH (ref 1.7–7.7)
Platelets: 256 10*3/uL (ref 150–400)
RBC: 5.07 MIL/uL (ref 3.87–5.11)
RDW: 12.9 % (ref 11.5–15.5)
WBC: 19.5 10*3/uL — AB (ref 4.0–10.5)

## 2017-12-29 LAB — PREGNANCY, URINE: Preg Test, Ur: NEGATIVE

## 2017-12-29 LAB — URINALYSIS, ROUTINE W REFLEX MICROSCOPIC
BACTERIA UA: NONE SEEN
GLUCOSE, UA: NEGATIVE mg/dL
Ketones, ur: NEGATIVE mg/dL
Leukocytes, UA: NEGATIVE
NITRITE: NEGATIVE
PH: 5 (ref 5.0–8.0)
PROTEIN: 100 mg/dL — AB
Specific Gravity, Urine: 1.034 — ABNORMAL HIGH (ref 1.005–1.030)

## 2017-12-29 LAB — LIPASE, BLOOD: Lipase: 21 U/L (ref 11–51)

## 2017-12-29 MED ORDER — PROMETHAZINE HCL 25 MG PO TABS
25.0000 mg | ORAL_TABLET | Freq: Four times a day (QID) | ORAL | 0 refills | Status: DC | PRN
Start: 2017-12-29 — End: 2021-08-20

## 2017-12-29 MED ORDER — IOPAMIDOL (ISOVUE-300) INJECTION 61%
100.0000 mL | Freq: Once | INTRAVENOUS | Status: AC | PRN
  Administered 2017-12-29: 100 mL via INTRAVENOUS

## 2017-12-29 MED ORDER — ONDANSETRON HCL 4 MG/2ML IJ SOLN
4.0000 mg | Freq: Once | INTRAMUSCULAR | Status: AC
  Administered 2017-12-29: 4 mg via INTRAVENOUS
  Filled 2017-12-29: qty 2

## 2017-12-29 NOTE — ED Provider Notes (Signed)
[facility] EMERGENCY DEPARTMENT Provider Note   CSN: [id number] Arrival date & time: 12/28/17  2310     History   Chief Complaint Chief Complaint  Patient presents with  . Emesis    HPI Allison Sandoval is a 27 y.o. female.  Patient presents with nausea vomiting and diarrhea that onset today.  She lost count of how many times she had emesis and diarrhea.  No blood in either one.  She is of mild diffuse abdominal pain.  No fever.  No pain with urination or blood in the urine.  There has been sick contacts with children at home.  No recent travel or antibiotic use.  She is concerned because she takes lamotrigine for seizure disorder and has not been able to take her evening dose because she threw it up.  She did take her dose this morning.  Last seizure was 1 month ago.  She denies any history of pregnancy. Took 8 mg of zofran at home without relief.    The history is provided by the patient.  Emesis   Associated symptoms include abdominal pain and diarrhea. Pertinent negatives include no arthralgias, no cough, no fever, no headaches and no myalgias.    Past Medical History:  Diagnosis Date  . Seizures (HCC)    most recent 11/15/17    Patient Active Problem List   Diagnosis Date Noted  . Seizures (HCC) 10/23/2016    Past Surgical History:  Procedure Laterality Date  . ADENOIDECTOMY    . WISDOM TOOTH EXTRACTION      OB History    No data available       Home Medications    Prior to Admission medications   Medication Sig Start Date End Date Taking? Authorizing Provider  lamoTRIgine (LAMICTAL) 100 MG tablet Take 1 tablet (100 mg total) by mouth 2 (two) times daily. 12/09/17   Butch Penny, NP  ORSYTHIA 0.1-20 MG-MCG tablet Take 1 tablet by mouth daily. 03/30/16   [provider]    Family History Family History  Problem Relation Age of Onset  . Hypertension Mother   . Healthy Father     Social History Social History   Tobacco Use  . Smoking  status: Current Every Day Smoker    Packs/day: 1.00    Types: Cigarettes  . Smokeless tobacco: Never Used  . Tobacco comment: 11/18/17 1/2 ppd  Substance Use Topics  . Alcohol use: Yes    Comment: occasionally  . Drug use: No     Allergies   Patient has no known allergies.   Review of Systems Review of Systems  Constitutional: Positive for appetite change. Negative for activity change and fever.  HENT: Negative for congestion, nosebleeds and rhinorrhea.   Respiratory: Negative for cough, chest tightness and shortness of breath.   Cardiovascular: Negative for chest pain.  Gastrointestinal: Positive for abdominal pain, diarrhea, nausea and vomiting.  Genitourinary: Negative for dysuria, hematuria, vaginal bleeding and vaginal discharge.  Musculoskeletal: Negative for arthralgias and myalgias.  Neurological: Negative for dizziness, weakness and headaches.    all other systems are negative except as noted in the HPI and PMH.    Physical Exam Updated Vital Signs BP 109/76 (BP Location: Right Arm)   Pulse (!) 101   Temp 98.1 F (36.7 C) (Oral)   Resp 18   Ht 5\' 3"  (1.6 m)   Wt 57.2 kg (126 lb)   LMP 11/30/2017   SpO2 100%   BMI 22.32 kg/m   Physical  Exam  Constitutional: She is oriented to person, place, and time. She appears well-developed and well-nourished. No distress.  HENT:  Head: Normocephalic and atraumatic.  Mouth/Throat: Oropharynx is clear and moist. No oropharyngeal exudate.  Moist mucus membranes  Eyes: Conjunctivae and EOM are normal. Pupils are equal, round, and reactive to light.  Neck: Normal range of motion. Neck supple.  No meningismus.  Cardiovascular: Normal rate, regular rhythm, normal heart sounds and intact distal pulses.  No murmur heard. Pulmonary/Chest: Effort normal and breath sounds normal. No respiratory distress.  Abdominal: Soft. There is tenderness. There is no rebound and no guarding.  Mild suprapubic tenderness. No RLQ tenderness    Musculoskeletal: Normal range of motion. She exhibits no edema or tenderness.  No CVAT  Neurological: She is alert and oriented to person, place, and time. No cranial nerve deficit. She exhibits normal muscle tone. Coordination normal.  No ataxia on finger to nose bilaterally. No pronator drift. 5/5 strength throughout. CN 2-12 intact.Equal grip strength. Sensation intact.   Skin: Skin is warm.  Psychiatric: She has a normal mood and affect. Her behavior is normal.  Nursing note and vitals reviewed.    ED Treatments / Results  Labs (all labs ordered are listed, but only abnormal results are displayed) Labs Reviewed  URINALYSIS, ROUTINE W REFLEX MICROSCOPIC - Abnormal; Notable for the following components:      Result Value   Color, Urine AMBER (*)    APPearance CLOUDY (*)    Specific Gravity, Urine 1.034 (*)    Hgb urine dipstick LARGE (*)    Bilirubin Urine SMALL (*)    Protein, ur 100 (*)    Squamous Epithelial / LPF 6-30 (*)    Non Squamous Epithelial 0-5 (*)    All other components within normal limits  CBC WITH DIFFERENTIAL/PLATELET - Abnormal; Notable for the following components:   WBC 19.5 (*)    Hemoglobin 16.0 (*)    HCT 48.3 (*)    Neutro Abs 17.6 (*)    Monocytes Absolute 0.0 (*)    All other components within normal limits  COMPREHENSIVE METABOLIC PANEL - Abnormal; Notable for the following components:   CO2 18 (*)    Glucose, Bld 136 (*)    Total Protein 8.8 (*)    All other components within normal limits  PREGNANCY, URINE  LIPASE, BLOOD    EKG  EKG Interpretation None       Radiology Ct Abdomen Pelvis W Contrast  Result Date: 12/29/2017 CLINICAL DATA:  Vomiting for several hours. Sick exposure. History of seizures. EXAM: CT ABDOMEN AND PELVIS WITH CONTRAST TECHNIQUE: Multidetector CT imaging of the abdomen and pelvis was performed using the standard protocol following bolus administration of intravenous contrast. CONTRAST:  100mL ISOVUE-300  IOPAMIDOL (ISOVUE-300) INJECTION 61% COMPARISON:  None. FINDINGS: LOWER CHEST: Lung bases are clear. Included heart size is normal. No pericardial effusion. HEPATOBILIARY: Liver and gallbladder are normal. Focal fatty infiltration about the falciform ligament. PANCREAS: Normal. SPLEEN: Normal. ADRENALS/URINARY TRACT: Kidneys are orthotopic, demonstrating symmetric enhancement. No nephrolithiasis, hydronephrosis or solid renal masses. The unopacified ureters are normal in course and caliber. Urinary bladder is partially distended and unremarkable. Normal adrenal glands. STOMACH/BOWEL: Fluid-filled small and large bowel. Multiple loops of small bowel wall thickening with mild hyperemia measuring to 2.8 cm. VASCULAR/LYMPHATIC: Aortoiliac vessels are normal in course and caliber. No lymphadenopathy by CT size criteria. REPRODUCTIVE: Normal.  2.8 cm dominant follicle/cyst LEFT ovary. OTHER: No intraperitoneal free fluid or free air. MUSCULOSKELETAL: Nonacute.  IMPRESSION: 1. Fluid-filled small and large bowel compatible with enteritis. No complication. Electronically Signed   By: Awilda Metro M.D.   On: 12/29/2017 02:13    Procedures Procedures (including critical care time)  Medications Ordered in ED Medications  sodium chloride 0.9 % bolus 1,000 mL (not administered)  promethazine (PHENERGAN) injection 25 mg (not administered)     Initial Impression / Assessment and Plan / ED Course  I have reviewed the triage vital signs and the nursing notes.  Pertinent labs & imaging results that were available during my care of the patient were reviewed by me and considered in my medical decision making (see chart for details).    Patient with 1 day history of vomiting, diarrhea and abdominal cramping.  Sick contacts at home.  Moist mucous membranes.  Non-peritoneal abdominal exam.  Patient given IV fluids and antiemetics.  Labs show leukocytosis.  Pregnancy test negative. Blood in UA.  On repeat exam,  patient does have some right-sided abdominal pain to palpation. will evaluate appendix.  CT scan is negative for appendicitis.  Patient is able to tolerate p.o. Abdomen is soft.   Suspect viral gastroenteritis.  We will treat supportively with oral hydration and PCP follow-up.  Return precautions discussed  BP 120/79 (BP Location: Right Arm)   Pulse 91   Temp 98.7 F (37.1 C) (Oral)   Resp 18   Ht 5\' 3"  (1.6 m)   Wt 57.2 kg (126 lb)   LMP 11/30/2017   SpO2 100%   BMI 22.32 kg/m    Final Clinical Impressions(s) / ED Diagnoses   Final diagnoses:  Gastroenteritis    ED Discharge Orders    None       Glynn Octave, MD 12/29/17 [id number]

## 2017-12-29 NOTE — ED Notes (Signed)
Pt has been given a cup of ice chips and Ginger Ale, tolerated well

## 2017-12-29 NOTE — Discharge Instructions (Signed)
This is probably a viral syndrome that should run its course in a day or 2.  Keep yourself hydrated.  Follow-up with your primary doctor.  Return to the ED if develop worsening symptoms including pain especially on the right side of her stomach, fever, not able to tolerate anything by mouth or any other concerns.

## 2018-05-04 IMAGING — CT CT ABD-PELV W/ CM
2 of 4 series · 16 of 46 positions shown, 18 images · IV contrast (Isovue)
Comparison: None.

CLINICAL DATA: Vomiting for several hours. Sick exposure. History
of seizures.

EXAM:
CT ABDOMEN AND PELVIS WITH CONTRAST
TECHNIQUE: Multidetector CT imaging of the abdomen and pelvis was performed
using the standard protocol following bolus administration of
intravenous contrast.
CONTRAST:  100mL 2VJVQ2-377 IOPAMIDOL (2VJVQ2-377) INJECTION 61%

[Series 2: axial st · axial · 0.62mm/px · z∈[+1649,+2074]mm · 13 of 95 slices shown, 15 images]
[im 5/95  soft-tissue]
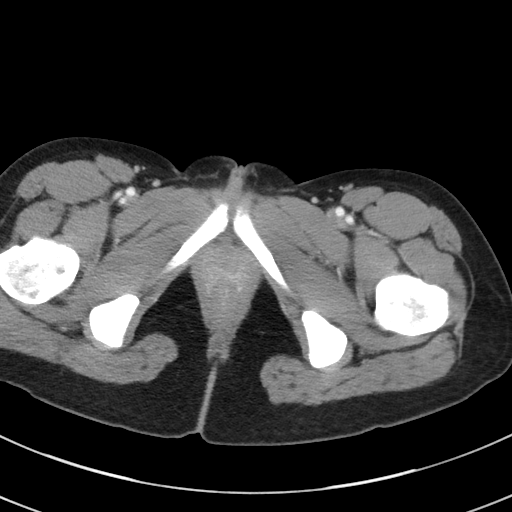
[im 5/95  bone]
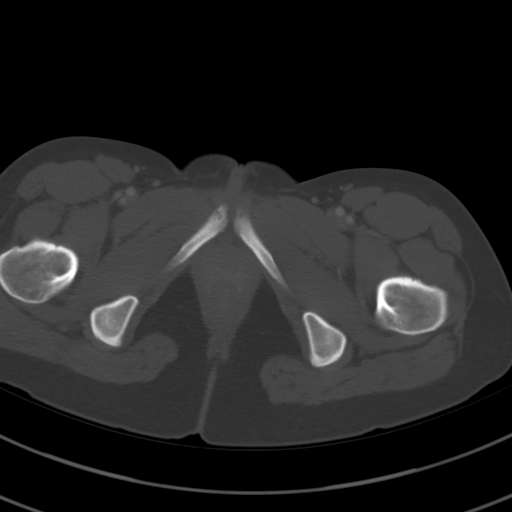
[im 13/95  soft-tissue]
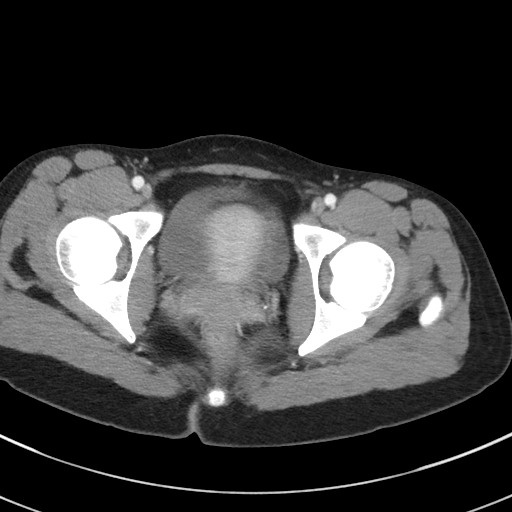
[im 21/95  soft-tissue]
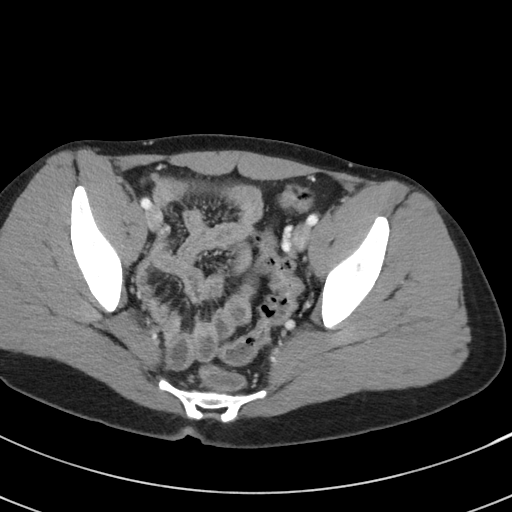
[im 25/95  soft-tissue]
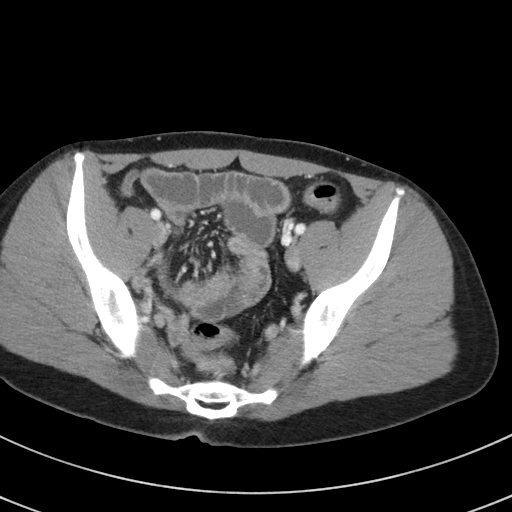
[im 33/95  soft-tissue]
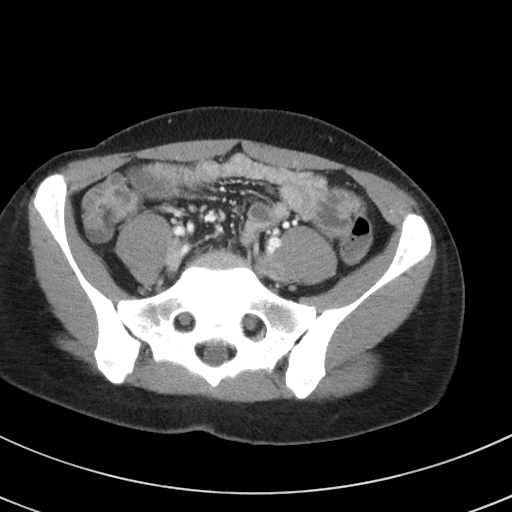
[im 41/95  soft-tissue]
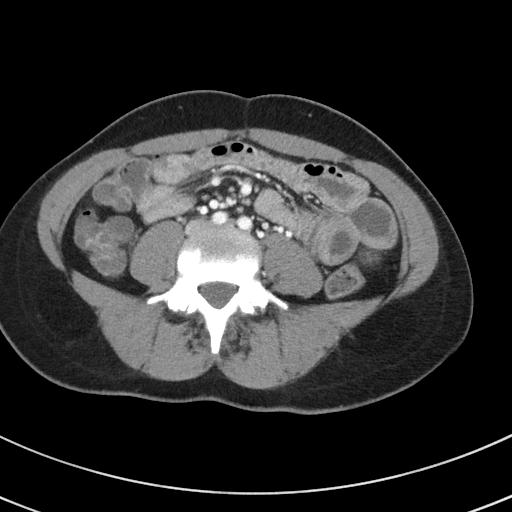
[im 50/95  soft-tissue]
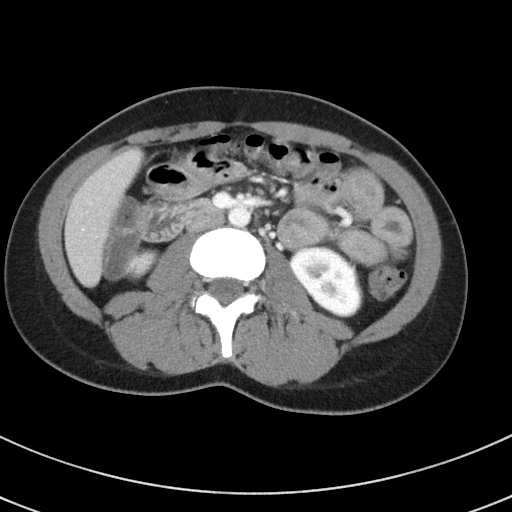
[im 54/95  soft-tissue]
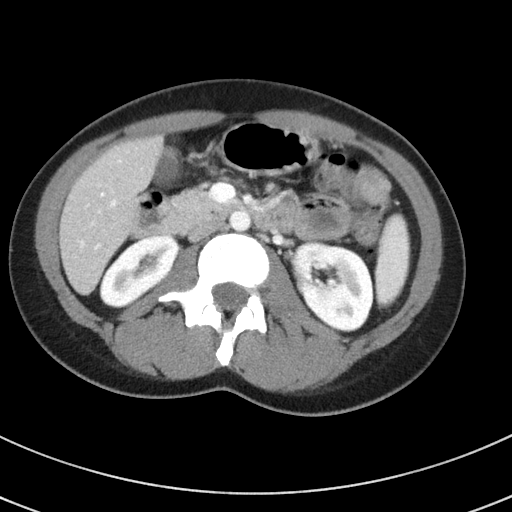
[im 62/95  soft-tissue]
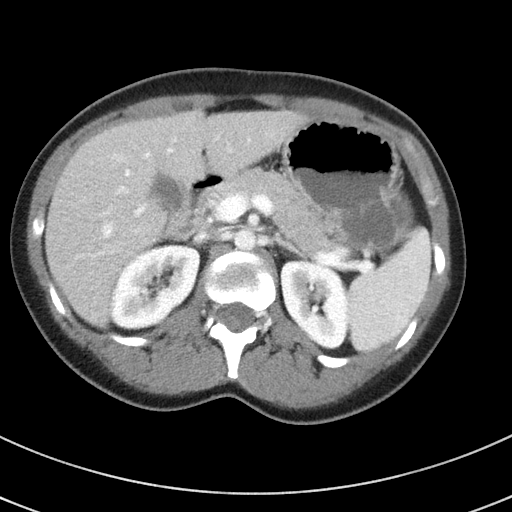
[im 62/95  bone]
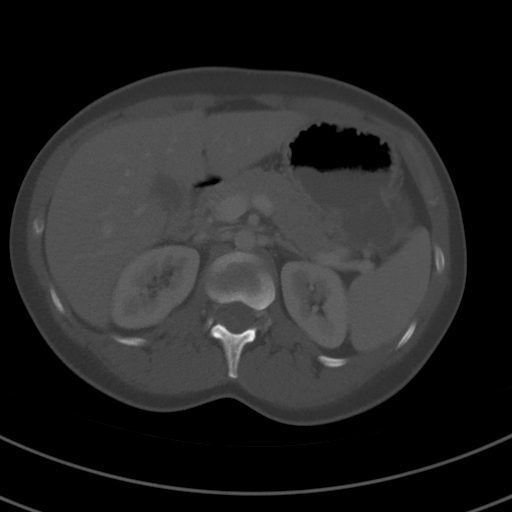
[im 70/95  soft-tissue]
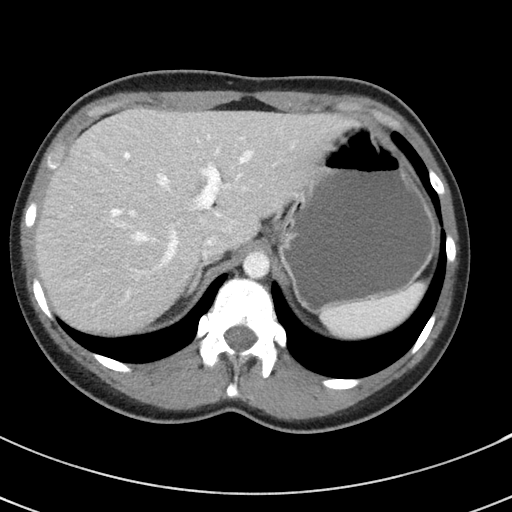
[im 74/95  soft-tissue]
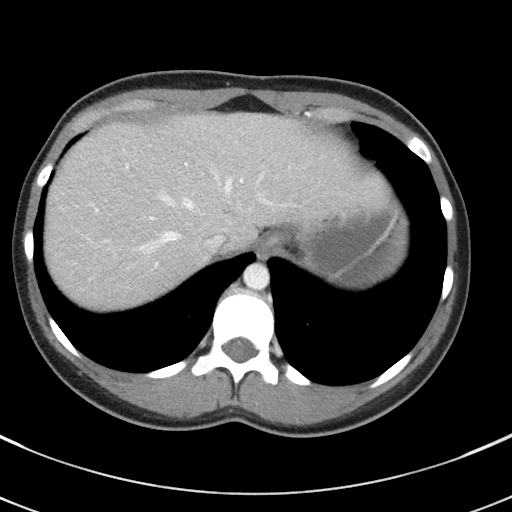
[im 82/95  soft-tissue]
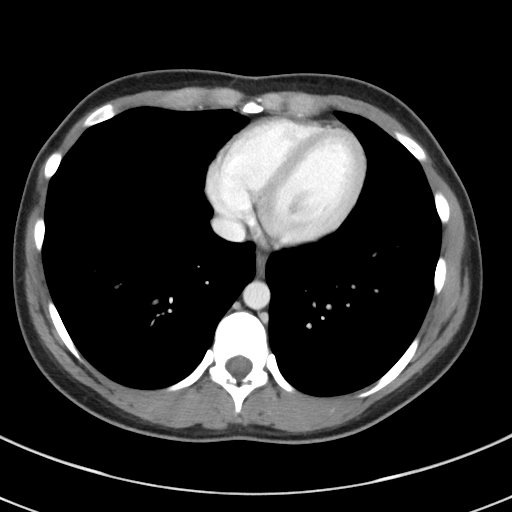
[im 90/95  soft-tissue]
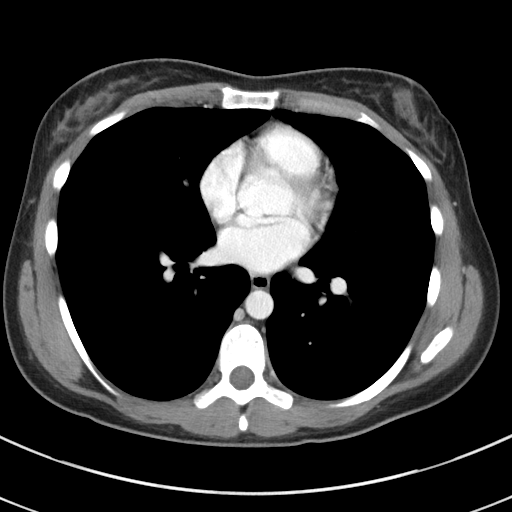

[Series 5: coronal st · coronal · 0.65mm/px · 3 of 83 slices shown]
[im 28/83  soft-tissue]
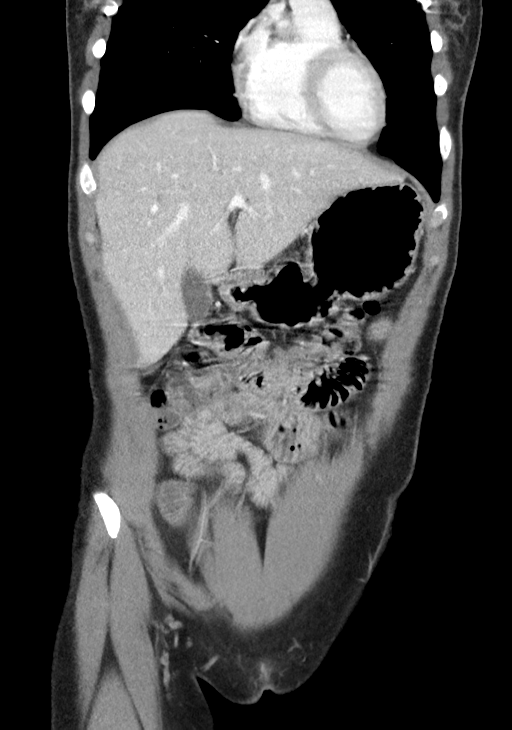
[im 37/83  soft-tissue]
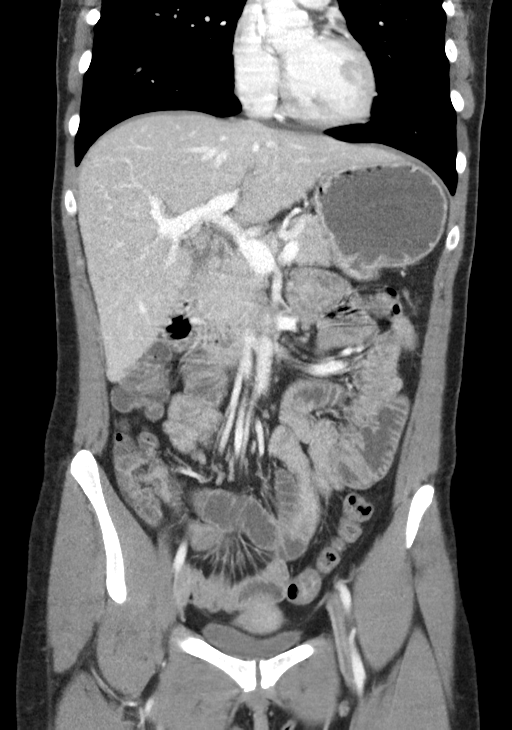
[im 46/83  soft-tissue]
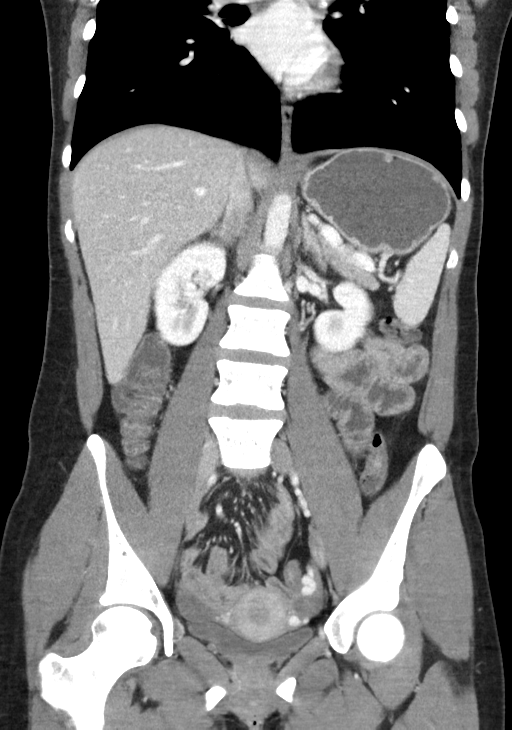

[16 of 46 positions shown; findings below may reference images not displayed]

FINDINGS: LOWER CHEST: Lung bases are clear. Included heart size is normal. No
pericardial effusion.

HEPATOBILIARY: Liver and gallbladder are normal. Focal fatty
infiltration about the falciform ligament.

PANCREAS: Normal.

SPLEEN: Normal.

ADRENALS/URINARY TRACT: Kidneys are orthotopic, demonstrating
symmetric enhancement. No nephrolithiasis, hydronephrosis or solid
renal masses. The unopacified ureters are normal in course and
caliber. Urinary bladder is partially distended and unremarkable.
Normal adrenal glands.

STOMACH/BOWEL: Fluid-filled small and large bowel. Multiple loops of
small bowel wall thickening with mild hyperemia measuring to 2.8 cm.

VASCULAR/LYMPHATIC: Aortoiliac vessels are normal in course and
caliber. No lymphadenopathy by CT size criteria.

REPRODUCTIVE: Normal.  2.8 cm dominant follicle/cyst LEFT ovary.

OTHER: No intraperitoneal free fluid or free air.

MUSCULOSKELETAL: Nonacute.
IMPRESSION: 1. Fluid-filled small and large bowel compatible with enteritis. No
complication.

## 2018-05-20 ENCOUNTER — Encounter: Payer: Self-pay | Admitting: Adult Health

## 2018-05-20 ENCOUNTER — Ambulatory Visit: Payer: Self-pay | Admitting: Adult Health

## 2018-05-20 ENCOUNTER — Ambulatory Visit (INDEPENDENT_AMBULATORY_CARE_PROVIDER_SITE_OTHER): Payer: BLUE CROSS/BLUE SHIELD | Admitting: Adult Health

## 2018-05-20 VITALS — BP 122/78 | HR 77 | Ht 63.0 in | Wt 136.0 lb

## 2018-05-20 DIAGNOSIS — R569 Unspecified convulsions: Secondary | ICD-10-CM

## 2018-05-20 MED ORDER — LAMOTRIGINE 100 MG PO TABS: 100.0000 mg | ORAL_TABLET | Freq: Two times a day (BID) | ORAL | 3 refills | Status: DC

## 2018-05-20 NOTE — Progress Notes (Signed)
PATIENT: Allison Sandoval DOB: Jul 11, 1991  REASON FOR VISIT: follow up HISTORY FROM: patient  HISTORY OF PRESENT ILLNESS: Today 05/20/18:  Allison Sandoval is a 27 year old female with a history of seizures.  She returns today for follow-up.  She is currently on Lamictal 100 mg twice a day.  She denies any additional seizure events.  She is no longer taking Xanax.  She reports that she does have some issues with anxiety.  She operates a Librarian, academic without difficulty.  She continues to work for [employer].  Denies any changes with her gait or balance.  She returns today for evaluation.  HISTORY 11/18/17 Allison Sandoval is a 27 year old female with a history of a seizure event in December 2017.  She returns today for an evaluation.  The patient reports that after her visit in December 2017 with Dr. Terrace Arabia she had an additional seizure about 9 days later.  She reports that she was seizure-free for an entire year then had another seizure this past weekend.  She states that each seizure event seems to be related to her reducing or stopping her dose of Xanax.  Her first seizure she stopped abruptly and then had a seizure 5 days later.  The most recent seizure she decreased her dose and then had a seizure.  She advises that Xanax is not prescribed but rather she is obtaining it by other means.  She did have an MRI of the brain in December 2017 that was relatively unremarkable.  She however never had an EEG.  Her boyfriend who witnessed her seizure events states that she was convulsing in all 4 extremities.  She reports that she did not bite her tongue.  But denies loss of bowels or bladder.  Patient does reports that she smokes marijuana daily but denies any other recreational drug use.  She returns today for an evaluation.   REVIEW OF SYSTEMS: Out of a complete 14 system review of symptoms, the patient complains only of the following symptoms, and all other reviewed systems are  negative. Nervous/anxious  ALLERGIES: No Known Allergies  HOME MEDICATIONS: Outpatient Medications Prior to Visit  Medication Sig Dispense Refill  . lamoTRIgine (LAMICTAL) 100 MG tablet Take 1 tablet (100 mg total) by mouth 2 (two) times daily. 180 tablet 1  . ORSYTHIA 0.1-20 MG-MCG tablet Take 1 tablet by mouth daily.  3  . promethazine (PHENERGAN) 25 MG tablet Take 1 tablet (25 mg total) by mouth every 6 (six) hours as needed for nausea or vomiting. 30 tablet 0   No facility-administered medications prior to visit.     PAST MEDICAL HISTORY: Past Medical History:  Diagnosis Date  . Seizures (HCC)    most recent 11/15/17    PAST SURGICAL HISTORY: Past Surgical History:  Procedure Laterality Date  . ADENOIDECTOMY    . WISDOM TOOTH EXTRACTION      FAMILY HISTORY: Family History  Problem Relation Age of Onset  . Hypertension Mother   . Healthy Father     SOCIAL HISTORY: Social History   Socioeconomic History  . Marital status: Single    Spouse name: Not on file  . Number of children: 1  . Years of education: HS  . Highest education level: Not on file  Occupational History  . Occupation: Advertising copywriter  Social Needs  . Financial resource strain: Not on file  . Food insecurity:    Worry: Not on file    Inability: Not on file  . Transportation needs:  Medical: Not on file    Non-medical: Not on file  Tobacco Use  . Smoking status: Current Every Day Smoker    Packs/day: 1.00    Types: Cigarettes  . Smokeless tobacco: Never Used  . Tobacco comment: 11/18/17 1/2 ppd  Substance and Sexual Activity  . Alcohol use: Yes    Comment: occasionally  . Drug use: No  . Sexual activity: Not on file  Lifestyle  . Physical activity:    Days per week: Not on file    Minutes per session: Not on file  . Stress: Not on file  Relationships  . Social connections:    Talks on phone: Not on file    Gets together: Not on file    Attends religious service: Not on file     Active member of club or organization: Not on file    Attends meetings of clubs or organizations: Not on file    Relationship status: Not on file  . Intimate partner violence:    Fear of current or ex partner: Not on file    Emotionally abused: Not on file    Physically abused: Not on file    Forced sexual activity: Not on file  Other Topics Concern  . Not on file  Social History Narrative   Lives at home with her fiance and daughter.   Right-handed.   2 cups caffeine per day.      PHYSICAL EXAM  Vitals:   05/20/18 0856  BP: 122/78  Pulse: 77  Weight: 136 lb (61.7 kg)  Height: 5\' 3"  (1.6 m)   Body mass index is 24.09 kg/m.  Generalized: Well developed, in no acute distress   Neurological examination  Mentation: Alert oriented to time, place, history taking. Follows all commands speech and language fluent Cranial nerve II-XII: Pupils were equal round reactive to light. Extraocular movements were full, visual field were full on confrontational test. Facial sensation and strength were normal. Uvula tongue midline. Head turning and shoulder shrug  were normal and symmetric. Motor: The motor testing reveals 5 over 5 strength of all 4 extremities. Good symmetric motor tone is noted throughout.  Sensory: Sensory testing is intact to soft touch on all 4 extremities. No evidence of extinction is noted.  Coordination: Cerebellar testing reveals good finger-nose-finger and heel-to-shin bilaterally.  Gait and station: Gait is normal. Reflexes: Deep tendon reflexes are symmetric and normal bilaterally.   DIAGNOSTIC DATA (LABS, IMAGING, TESTING) - I reviewed patient records, labs, notes, testing and imaging myself where available.  Lab Results  Component Value Date   WBC 19.5 (H) 12/28/2017   HGB 16.0 (H) 12/28/2017   HCT 48.3 (H) 12/28/2017   MCV 95.3 12/28/2017   PLT 256 12/28/2017      Component Value Date/Time   NA 141 12/28/2017 2358   K 3.8 12/28/2017 2358   CL 110  12/28/2017 2358   CO2 18 (L) 12/28/2017 2358   GLUCOSE 136 (H) 12/28/2017 2358   BUN 16 12/28/2017 2358   CREATININE 0.93 12/28/2017 2358   CALCIUM 10.0 12/28/2017 2358   PROT 8.8 (H) 12/28/2017 2358   ALBUMIN 5.0 12/28/2017 2358   AST 22 12/28/2017 2358   ALT 22 12/28/2017 2358   ALKPHOS 56 12/28/2017 2358   BILITOT 0.7 12/28/2017 2358   GFRNONAA >60 12/28/2017 2358   GFRAA >60 12/28/2017 2358      ASSESSMENT AND PLAN 27 y.o. year old female  has a past medical history of Seizures (HCC). here  with:  1.  Seizures  Overall the patient is doing well.  She will continue on Lamictal 100 mg twice a day.  I will check blood work today.  I have advised that if she has any seizure events she should let us know.  She will follow-up in 6 months or sooner if needed.   I spent 15 minutes with the patient. 50% of this time was spent discussing her medication and plan of care   Butch Penny, MSN, NP-C 05/20/2018, 9:11 AM [facility] [address], [address] [place], [place] [address] [phone]

## 2018-05-20 NOTE — Patient Instructions (Signed)
Your Plan:  Continue Lamictal 100 mg twice a day Blood work today If your symptoms worsen or you develop new symptoms please let us know.       Thank you for coming to see us at [facility]. I hope we have been able to provide you high quality care today.  You may receive a patient satisfaction survey over the next few weeks. We would appreciate your feedback and comments so that we may continue to improve ourselves and the health of our patients.  

## 2018-05-22 ENCOUNTER — Telehealth: Payer: Self-pay | Admitting: *Deleted

## 2018-05-22 LAB — CBC WITH DIFFERENTIAL/PLATELET
BASOS ABS: 0 10*3/uL (ref 0.0–0.2)
Basos: 1 %
EOS (ABSOLUTE): 0.5 10*3/uL — ABNORMAL HIGH (ref 0.0–0.4)
EOS: 7 %
HEMOGLOBIN: 14.7 g/dL (ref 11.1–15.9)
Hematocrit: 45.3 % (ref 34.0–46.6)
IMMATURE GRANS (ABS): 0 10*3/uL (ref 0.0–0.1)
IMMATURE GRANULOCYTES: 0 %
LYMPHS: 30 %
Lymphocytes Absolute: 2.3 10*3/uL (ref 0.7–3.1)
MCH: 31.7 pg (ref 26.6–33.0)
MCHC: 32.5 g/dL (ref 31.5–35.7)
MCV: 98 fL — ABNORMAL HIGH (ref 79–97)
MONOCYTES: 7 %
Monocytes Absolute: 0.6 10*3/uL (ref 0.1–0.9)
Neutrophils Absolute: 4.3 10*3/uL (ref 1.4–7.0)
Neutrophils: 55 %
Platelets: 279 10*3/uL (ref 150–450)
RBC: 4.63 x10E6/uL (ref 3.77–5.28)
RDW: 12.6 % (ref 12.3–15.4)
WBC: 7.7 10*3/uL (ref 3.4–10.8)

## 2018-05-22 LAB — COMPREHENSIVE METABOLIC PANEL
ALK PHOS: 59 IU/L (ref 39–117)
ALT: 14 IU/L (ref 0–32)
AST: 14 IU/L (ref 0–40)
Albumin/Globulin Ratio: 2.1 (ref 1.2–2.2)
Albumin: 4.6 g/dL (ref 3.5–5.5)
BUN/Creatinine Ratio: 16 (ref 9–23)
BUN: 12 mg/dL (ref 6–20)
CHLORIDE: 104 mmol/L (ref 96–106)
CO2: 22 mmol/L (ref 20–29)
Calcium: 9.4 mg/dL (ref 8.7–10.2)
Creatinine, Ser: 0.76 mg/dL (ref 0.57–1.00)
GFR calc Af Amer: 124 mL/min/{1.73_m2} (ref >59)
GFR calc non Af Amer: 108 mL/min/{1.73_m2} (ref >59)
GLUCOSE: 91 mg/dL (ref 65–99)
Globulin, Total: 2.2 g/dL (ref 1.5–4.5)
Potassium: 5 mmol/L (ref 3.5–5.2)
Sodium: 141 mmol/L (ref 134–144)
TOTAL PROTEIN: 6.8 g/dL (ref 6.0–8.5)

## 2018-05-22 LAB — LAMOTRIGINE LEVEL: LAMOTRIGINE LVL: 5.2 ug/mL (ref 2.0–20.0)

## 2018-05-22 NOTE — Telephone Encounter (Signed)
LVM informing patient her labs are unremarkable. Advised she continue taking Lamictal as prescribed. Advised the office is now closed but may call Monday for any questions.

## 2018-05-25 NOTE — Telephone Encounter (Signed)
Advised patient of unremarkable labs per previous message. °

## 2018-05-26 NOTE — Progress Notes (Signed)
I have reviewed and agreed above plan. 

## 2018-11-24 ENCOUNTER — Ambulatory Visit: Payer: BLUE CROSS/BLUE SHIELD | Admitting: Adult Health

## 2019-02-09 ENCOUNTER — Telehealth: Payer: Self-pay | Admitting: Neurology

## 2019-02-09 NOTE — Telephone Encounter (Signed)
Dr. Montey Hora - please see message below. 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 Sounded like she wanted to take a few days off from work. I reviewed chart, looks like last seizure was January 2019, over a year ago prior to starting Lamictal. No seizures since starting Lamictal (100mg  bid) even during the titration period and with an illness in 12/2017.  EEG was negative, Last seen in the office 05/2018 by Aundra Millet and was doing well, Lamictal level was within normal limits 05/2018. No indication of pregnancy.  Patient paged me this evening because she forgot her morning dose of Lamictal and just took her evening dose. I told her to take her evening dose and not to double up. I informed her there is a risk of breakthrough seizures if she misses even one dose. As far as I know, there is no data to inform her exactly what her chances are of having a breakthrough seizure or when she will be "safe", as she asked me. She has to use her own judgement in deciding on driving and working for several days, but again I stressed there is a risk of breakthrough seizure especially in the next day or two and I would err on the safe side. I discussed the following with patient, this is from epilepsy.com:  It's quite common for people with epilepsy to miss a single dose once in a while. Often nothing bad happens but your chance of having a seizure may be higher. Missing one dose is more likely to cause seizures if you're scheduled to take your medicine only once a day. Then if you miss a dose, you've missed a full day of medication. If you take it two to four times a day, the risk from missing one dose is less. But if you miss several doses in a row, the likelihood of a breakthrough seizure will be higher.

## 2019-02-10 NOTE — Telephone Encounter (Signed)
Noted  

## 2019-02-10 NOTE — Telephone Encounter (Signed)
See previous telephone call

## 2019-02-11 ENCOUNTER — Telehealth: Payer: Self-pay | Admitting: Neurology

## 2019-02-11 NOTE — Telephone Encounter (Signed)
This patient is on the schedule to see Aundra Millet NP for Dr.Yan. Dashea is out of office until 5/6. Patient could be called and offered a virtual visit with Sarah.

## 2019-02-16 NOTE — Telephone Encounter (Signed)
Patient has a balance. I have to talked to Angie in Billing and she want's to hold off on apt for Now. Allison Sandoval has talked to patient also.  Patient will call back to the office she has to make a payment first. Thanks Annabelle Harman.

## 2019-02-16 NOTE — Telephone Encounter (Signed)
Patient suppose to have a follow up visit in Jan 2020, please schedule a virtual visit with me to discuss her seizure.

## 2019-02-17 NOTE — Telephone Encounter (Signed)
Unable to get in contact with the patient to offer a virtual webex visit with Allison Ege, NP due to Butch Penny, NP being on maternity leave until 03/10/2019. I left a voicemail asking her return my call. Office number was provided.

## 2019-03-03 ENCOUNTER — Ambulatory Visit: Payer: BLUE CROSS/BLUE SHIELD | Admitting: Adult Health

## 2019-05-24 ENCOUNTER — Telehealth: Payer: Self-pay | Admitting: Adult Health

## 2019-05-24 ENCOUNTER — Other Ambulatory Visit: Payer: Self-pay | Admitting: Adult Health

## 2019-05-24 MED ORDER — LAMOTRIGINE 100 MG PO TABS: 100.0000 mg | ORAL_TABLET | Freq: Two times a day (BID) | ORAL | 1 refills | Status: DC

## 2019-05-24 NOTE — Telephone Encounter (Signed)
Pt has appt scheduled 10/20 with MM/NP.  Refilled 6 months. Lamictal 100mg  po bid.

## 2019-05-24 NOTE — Telephone Encounter (Signed)
Pt states that the pharmacy sent in a request for her lamoTRIgine (LAMICTAL) 100 MG tablet. It was to be refilled on the 17. Pt is needing this as soon as it can be sent in.

## 2019-08-16 ENCOUNTER — Other Ambulatory Visit: Payer: Self-pay | Admitting: Adult Health

## 2019-08-16 MED ORDER — LAMOTRIGINE 100 MG PO TABS: 100.0000 mg | ORAL_TABLET | Freq: Two times a day (BID) | ORAL | 0 refills | Status: DC

## 2019-08-16 NOTE — Telephone Encounter (Signed)
Pt has called for a refill on her lamoTRIgine (LAMICTAL) 100 MG tablet [place] [phone] , pt is asking if she can go ahead and get her 90 day fill or if she can at least get enough pills to last her until appointment date and time

## 2019-08-16 NOTE — Telephone Encounter (Signed)
I will send in refill of Lamictal, 180 tablets, no refills. She should keep her appointment on 08/30/2019.

## 2019-08-16 NOTE — Addendum Note (Signed)
Addended by: Suzzanne Cloud on: 08/16/2019 12:43 PM   Modules accepted: Orders

## 2019-08-18 ENCOUNTER — Ambulatory Visit: Payer: BLUE CROSS/BLUE SHIELD | Admitting: Adult Health

## 2019-08-18 ENCOUNTER — Encounter

## 2019-08-29 NOTE — Progress Notes (Signed)
    Virtual Visit via Video Note  I connected with Allison Sandoval on 08/30/19 at  3:15 PM EDT by a video enabled telemedicine application and verified that I am speaking with the correct person using two identifiers.  Location: Patient: At her home Provider: In the office    I discussed the limitations of evaluation and management by telemedicine and the availability of in person appointments. The patient expressed understanding and agreed to proceed.  History of Present Illness: 08/30/2019 SS: Ms. Allison Sandoval is a 28 year old female with history of seizures.  She has not had recurrent seizure since last seen.  She continues taking Lamictal 100 mg twice a day.  She is tolerating medication well without side effect.  She works at [employer] loading trucks. She indicates overall her health has been well.  She drives a car without difficulty.  She denies any issues with her gait or balance.  She denies any new problems or concerns.  She presents today for follow-up via virtual visit.  05/20/2018 MM: Ms.Allison Sandoval is a 28 year old female with a history of seizures.  She returns today for follow-up.  She is currently on Lamictal 100 mg twice a day.  She denies any additional seizure events.  She is no longer taking Xanax.  She reports that she does have some issues with anxiety.  She operates a Teacher, music without difficulty.  She continues to work for [employer].  Denies any changes with her gait or balance.  She returns today for evaluation.   Observations/Objective: Via virtual visit, patient is alert and oriented, speech is clear and concise, answers questions appropriately, facial symmetry noted, symmetric movement of the eyes, symmetric shoulder shrug, no arm drift, gait is intact  Assessment and Plan: 1.  Seizures  Overall, she has continued to do quite well.  She will continue taking Lamictal 100 mg twice a day. She will come to the office in the next 1 to 2 weeks to have routine lab work done.  Once her  lab work results, I will send in a refill for 1 year.  She will follow-up in this office in 1 year or sooner if needed.  Follow Up Instructions: 1 year 09/04/2020 11:15   I discussed the assessment and treatment plan with the patient. The patient was provided an opportunity to ask questions and all were answered. The patient agreed with the plan and demonstrated an understanding of the instructions.   The patient was advised to call back or seek an in-person evaluation if the symptoms worsen or if the condition fails to improve as anticipated.  I provided 15 minutes of non-face-to-face time during this encounter.  Evangeline Dakin, DNP  [facility] [address] [place], [place] [address] [phone]

## 2019-08-30 ENCOUNTER — Telehealth (INDEPENDENT_AMBULATORY_CARE_PROVIDER_SITE_OTHER): Payer: BC Managed Care – PPO | Admitting: Neurology

## 2019-08-30 ENCOUNTER — Encounter: Payer: Self-pay | Admitting: Neurology

## 2019-08-30 DIAGNOSIS — R569 Unspecified convulsions: Secondary | ICD-10-CM

## 2019-08-30 NOTE — Progress Notes (Signed)
I have reviewed and agreed above plan. 

## 2019-09-23 ENCOUNTER — Other Ambulatory Visit: Payer: Self-pay

## 2019-09-23 DIAGNOSIS — Z20822 Contact with and (suspected) exposure to covid-19: Secondary | ICD-10-CM

## 2019-09-26 LAB — NOVEL CORONAVIRUS, NAA: SARS-CoV-2, NAA: NOT DETECTED

## 2019-12-01 ENCOUNTER — Other Ambulatory Visit: Payer: Self-pay | Admitting: Adult Health

## 2019-12-01 NOTE — Telephone Encounter (Addendum)
Pt states the reason as to why she has not been in office for lab work yet is because her youngest child had come down with Covid-19 and now she(the pt) is congested.  Pt states she should be able  to come for lab work within next few days but right now but she is asking for 1 month refill and when she is better she will come in for requested labs

## 2019-12-02 ENCOUNTER — Telehealth: Payer: Self-pay

## 2019-12-02 MED ORDER — LAMOTRIGINE 100 MG PO TABS: 100.0000 mg | ORAL_TABLET | Freq: Two times a day (BID) | ORAL | 0 refills | Status: DC

## 2019-12-02 NOTE — Addendum Note (Signed)
Addended by: Glean Salvo on: 12/02/2019 12:12 PM   Modules accepted: Orders

## 2019-12-02 NOTE — Telephone Encounter (Signed)
Error

## 2019-12-02 NOTE — Telephone Encounter (Signed)
Please see phone note.  Would this be okay?

## 2019-12-02 NOTE — Telephone Encounter (Signed)
No problem, will send in a refill, hope her child feels better soon. Refill sent.

## 2019-12-02 NOTE — Telephone Encounter (Signed)
I called pt and let her know that the lamotrigine was escribed for her to [facility].  She stated she will come in and get labs done next week, or soon.

## 2020-02-24 ENCOUNTER — Other Ambulatory Visit (INDEPENDENT_AMBULATORY_CARE_PROVIDER_SITE_OTHER): Payer: Self-pay

## 2020-02-24 DIAGNOSIS — Z0289 Encounter for other administrative examinations: Secondary | ICD-10-CM

## 2020-02-24 NOTE — Addendum Note (Signed)
Addended by: Tamera Stands D on: 02/24/2020 10:16 AM   Modules accepted: Orders

## 2020-02-25 LAB — LAMOTRIGINE LEVEL: Lamotrigine Lvl: 2.8 ug/mL (ref 2.0–20.0)

## 2020-02-25 LAB — COMPREHENSIVE METABOLIC PANEL
ALT: 64 IU/L — ABNORMAL HIGH (ref 0–32)
AST: 51 IU/L — ABNORMAL HIGH (ref 0–40)
Albumin/Globulin Ratio: 2 (ref 1.2–2.2)
Albumin: 4.6 g/dL (ref 3.9–5.0)
Alkaline Phosphatase: 54 IU/L (ref 39–117)
BUN/Creatinine Ratio: 14 (ref 9–23)
BUN: 13 mg/dL (ref 6–20)
Bilirubin Total: 0.3 mg/dL (ref 0.0–1.2)
CO2: 22 mmol/L (ref 20–29)
Calcium: 9.2 mg/dL (ref 8.7–10.2)
Chloride: 104 mmol/L (ref 96–106)
Creatinine, Ser: 0.9 mg/dL (ref 0.57–1.00)
GFR calc Af Amer: 100 mL/min/{1.73_m2} (ref >59)
GFR calc non Af Amer: 87 mL/min/{1.73_m2} (ref >59)
Globulin, Total: 2.3 g/dL (ref 1.5–4.5)
Glucose: 100 mg/dL — ABNORMAL HIGH (ref 65–99)
Potassium: 4.6 mmol/L (ref 3.5–5.2)
Sodium: 139 mmol/L (ref 134–144)
Total Protein: 6.9 g/dL (ref 6.0–8.5)

## 2020-02-25 LAB — CBC WITH DIFFERENTIAL/PLATELET
Basophils Absolute: 0.1 10*3/uL (ref 0.0–0.2)
Basos: 1 %
EOS (ABSOLUTE): 0.5 10*3/uL — ABNORMAL HIGH (ref 0.0–0.4)
Eos: 7 %
Hematocrit: 41.4 % (ref 34.0–46.6)
Hemoglobin: 13.7 g/dL (ref 11.1–15.9)
Immature Grans (Abs): 0 10*3/uL (ref 0.0–0.1)
Immature Granulocytes: 1 %
Lymphocytes Absolute: 2.6 10*3/uL (ref 0.7–3.1)
Lymphs: 34 %
MCH: 31.1 pg (ref 26.6–33.0)
MCHC: 33.1 g/dL (ref 31.5–35.7)
MCV: 94 fL (ref 79–97)
Monocytes Absolute: 0.5 10*3/uL (ref 0.1–0.9)
Monocytes: 7 %
Neutrophils Absolute: 3.9 10*3/uL (ref 1.4–7.0)
Neutrophils: 50 %
Platelets: 280 10*3/uL (ref 150–450)
RBC: 4.4 x10E6/uL (ref 3.77–5.28)
RDW: 12.3 % (ref 11.7–15.4)
WBC: 7.6 10*3/uL (ref 3.4–10.8)

## 2020-02-28 ENCOUNTER — Telehealth: Payer: Self-pay | Admitting: *Deleted

## 2020-02-28 DIAGNOSIS — R569 Unspecified convulsions: Secondary | ICD-10-CM

## 2020-02-28 MED ORDER — LAMOTRIGINE 100 MG PO TABS: 100.0000 mg | ORAL_TABLET | Freq: Two times a day (BID) | ORAL | 3 refills | Status: DC

## 2020-02-28 NOTE — Telephone Encounter (Signed)
Order for CMP placed in 3 months with reminder. If not done by PCP beforehand. Make sure to limit ETOH, Tylenol.

## 2020-02-28 NOTE — Telephone Encounter (Signed)
She stated did not drink, made aware that tylenol could be causing elevated liver function tests and to limit this.  She verbalized understanding.

## 2020-02-28 NOTE — Telephone Encounter (Signed)
I called pt and relayed labs look okay, exception, AST 51, ALT 64, no history of elevated liver enzymes. Any reason for this? Increase in Tylenol or ETOH? This should be rechecked in about 3 months, can be done through PCP but can do through [place] if needed, not likely from Lamictal, as has been on this for several years without problem. She has taken  Tylenol for headaches, she feels related to stress.  I relayed that she may need pcp to address and she is working on this.  She will come back to [place] in 3 months for liver enzyme testing, if not gone to pcp.  Needs refiils [facility] for her lamotrigine.

## 2020-02-28 NOTE — Telephone Encounter (Signed)
-----   Message from Glean Salvo, NP sent at 02/28/2020  9:14 AM EDT ----- Labs look okay, exception, AST 51, ALT 64, no history of elevated liver enzymes.  Any reason for this?  Increase in Tylenol or ETOH?  This should be rechecked in about 3 months, can be done through PCP but can do through [place] if needed, not likely from Lamictal, as has been on this for several years without problem.

## 2020-04-28 DIAGNOSIS — F419 Anxiety disorder, unspecified: Secondary | ICD-10-CM | POA: Diagnosis not present

## 2020-05-24 ENCOUNTER — Other Ambulatory Visit: Payer: Self-pay

## 2020-05-24 ENCOUNTER — Encounter: Payer: Self-pay | Admitting: Emergency Medicine

## 2020-05-24 ENCOUNTER — Ambulatory Visit
Admission: EM | Admit: 2020-05-24 | Discharge: 2020-05-24 | Disposition: A | Discharge status: Home / Self Care | Payer: Medicaid Other | Attending: Emergency Medicine | Admitting: Emergency Medicine

## 2020-05-24 DIAGNOSIS — J069 Acute upper respiratory infection, unspecified: Secondary | ICD-10-CM | POA: Diagnosis not present

## 2020-05-24 MED ORDER — PREDNISONE 10 MG PO TABS: 20.0000 mg | ORAL_TABLET | Freq: Every day | ORAL | 0 refills | Status: AC

## 2020-05-24 MED ORDER — FLUTICASONE PROPIONATE 50 MCG/ACT NA SUSP: 1.0000 | Freq: Every day | NASAL | 0 refills | Status: DC

## 2020-05-24 MED ORDER — BENZONATATE 100 MG PO CAPS: 100.0000 mg | ORAL_CAPSULE | Freq: Three times a day (TID) | ORAL | 0 refills | Status: DC

## 2020-05-24 NOTE — ED Triage Notes (Signed)
Started yesterday with facial pressure and nasal congestion.  No fevers. Fatigue.  Needs works note.

## 2020-05-24 NOTE — ED Provider Notes (Signed)
[facility]   [id number] 05/24/20 Arrival Time: 1103   Chief Complaint  Patient presents with  . Facial Pain    facial pressure   . Nasal Congestion  . Fatigue     SUBJECTIVE: History from: patient.  Allison Sandoval is a 29 y.o. female who presented to the urgent care for complaint of nasal congestion, cough, sinus pressure and sinus pain that started yesterday.  Report exposure to family member with same symptoms.  Denies recent travel.  Has tried OTC medication without relief.  Denies aggravating or alleviating factors.  Denies previous symptoms in the past.   Denies fever, chills, fatigue,  rhinorrhea, sore throat, SOB, wheezing, chest pain, nausea, changes in bowel or bladder habits.     ROS: As per HPI.  All other pertinent ROS negative.       Past Medical History:  Diagnosis Date  . Seizures (HCC)    most recent 11/15/17   Past Surgical History:  Procedure Laterality Date  . ADENOIDECTOMY    . WISDOM TOOTH EXTRACTION     No Known Allergies No current facility-administered medications on file prior to encounter.   Current Outpatient Medications on File Prior to Encounter  Medication Sig Dispense Refill  . lamoTRIgine (LAMICTAL) 100 MG tablet Take 1 tablet (100 mg total) by mouth 2 (two) times daily. 180 tablet 3  . ORSYTHIA 0.1-20 MG-MCG tablet Take 1 tablet by mouth daily.  3  . promethazine (PHENERGAN) 25 MG tablet Take 1 tablet (25 mg total) by mouth every 6 (six) hours as needed for nausea or vomiting. 30 tablet 0   Social History   Socioeconomic History  . Marital status: Single    Spouse name: Not on file  . Number of children: 1  . Years of education: HS  . Highest education level: Not on file  Occupational History  . Occupation: Housekeeper  Tobacco Use  . Smoking status: Current Every Day Smoker    Packs/day: 1.00    Types: Cigarettes  . Smokeless tobacco: Never Used  . Tobacco comment: 11/18/17 1/2 ppd  Substance and Sexual Activity   . Alcohol use: Yes    Comment: occasionally  . Drug use: No  . Sexual activity: Not on file  Other Topics Concern  . Not on file  Social History Narrative   Lives at home with her fiance and daughter.   Right-handed.   2 cups caffeine per day.   Social Determinants of Health   Financial Resource Strain:   . Difficulty of Paying Living Expenses:   Food Insecurity:   . Worried About Programme researcher, broadcasting/film/video in the Last Year:   . Barista in the Last Year:   Transportation Needs:   . Freight forwarder (Medical):   Marland Kitchen Lack of Transportation (Non-Medical):   Physical Activity:   . Days of Exercise per Week:   . Minutes of Exercise per Session:   Stress:   . Feeling of Stress :   Social Connections:   . Frequency of Communication with Friends and Family:   . Frequency of Social Gatherings with Friends and Family:   . Attends Religious Services:   . Active Member of Clubs or Organizations:   . Attends Banker Meetings:   Marland Kitchen Marital Status:   Intimate Partner Violence:   . Fear of Current or Ex-Partner:   . Emotionally Abused:   Marland Kitchen Physically Abused:   . Sexually Abused:    Family History  Problem Relation Age of Onset  . Hypertension Mother   . Healthy Father     OBJECTIVE:  Vitals:   05/24/20 1112 05/24/20 1113  BP: 127/85   Pulse: 92   Resp: 18   Temp: 98.5 F (36.9 C)   TempSrc: Oral   SpO2: 96%   Weight:  148 lb (67.1 kg)     General appearance: alert; appears fatigued, but nontoxic; speaking in full sentences and tolerating own secretions HEENT: NCAT; Ears: EACs clear, TMs pearly gray; Eyes: PERRL.  EOM grossly intact. Sinuses: nontender; Nose: nares patent without rhinorrhea, Throat: oropharynx clear, tonsils non erythematous or enlarged, uvula midline  Neck: supple without LAD Lungs: unlabored respirations, symmetrical air entry; cough: mild; no respiratory distress; CTAB Heart: regular rate and rhythm.  Radial pulses 2+ symmetrical  bilaterally Skin: warm and dry Psychological: alert and cooperative; normal mood and affect  LABS:  No results found for this or any previous visit (from the past 24 hour(s)).   ASSESSMENT & PLAN:  1. URI with cough and congestion     Meds ordered this encounter  Medications  . benzonatate (TESSALON) 100 MG capsule    Sig: Take 1 capsule (100 mg total) by mouth every 8 (eight) hours.    Dispense:  30 capsule    Refill:  0  . fluticasone (FLONASE) 50 MCG/ACT nasal spray    Sig: Place 1 spray into both nostrils daily for 14 days.    Dispense:  16 g    Refill:  0  . predniSONE (DELTASONE) 10 MG tablet    Sig: Take 2 tablets (20 mg total) by mouth daily for 6 days.    Dispense:  12 tablet    Refill:  0   Discharge Instructions  Get plenty of rest and push fluids Tessalon Perles prescribed for cough Prescribed flonase for nasal congestion and runny nose Low-dose prednisone was prescribed Use medications daily for symptom relief Use OTC medications like ibuprofen or tylenol as needed fever or pain Call or go to the ED if you have any new or worsening symptoms such as fever, worsening cough, shortness of breath, chest tightness, chest pain, turning blue, changes in mental status, etc...   Reviewed expectations re: course of current medical issues. Questions answered. Outlined signs and symptoms indicating need for more acute intervention. Patient verbalized understanding. After Visit Summary given.      Note: This document was prepared using Dragon voice recognition software and may include unintentional dictation errors.    Durward Parcel, FNP 05/24/20 1125

## 2020-05-24 NOTE — Discharge Instructions (Signed)
Get plenty of rest and push fluids Tessalon Perles prescribed for cough Prescribed flonase for nasal congestion and runny nose Low-dose prednisone was prescribed Use medications daily for symptom relief Use OTC medications like ibuprofen or tylenol as needed fever or pain Call or go to the ED if you have any new or worsening symptoms such as fever, worsening cough, shortness of breath, chest tightness, chest pain, turning blue, changes in mental status, etc..Marland Kitchen

## 2020-05-29 ENCOUNTER — Telehealth: Payer: Self-pay | Admitting: Neurology

## 2020-05-29 NOTE — Telephone Encounter (Signed)
-----   Message from Glean Salvo, NP sent at 02/28/2020 10:54 AM EDT ----- Call patient to get CMP evelated liver enzymes, order already placed, if PCP hasn't rechecked

## 2020-05-29 NOTE — Telephone Encounter (Signed)
I called pt she will come here and get her labs done for CMP recheck.  (liver enzymes).  She has decreased her tylenol intake, as almost daily to every now and then for headaches.  I gave her the hours and days of operation.

## 2020-06-15 ENCOUNTER — Other Ambulatory Visit: Payer: Self-pay

## 2020-06-15 ENCOUNTER — Ambulatory Visit
Admission: EM | Admit: 2020-06-15 | Discharge: 2020-06-15 | Disposition: A | Discharge status: Home / Self Care | Payer: Medicaid Other | Attending: Emergency Medicine | Admitting: Emergency Medicine

## 2020-06-15 DIAGNOSIS — M545 Low back pain, unspecified: Secondary | ICD-10-CM

## 2020-06-15 MED ORDER — PREDNISONE 20 MG PO TABS: 20.0000 mg | ORAL_TABLET | Freq: Two times a day (BID) | ORAL | 0 refills | Status: AC

## 2020-06-15 MED ORDER — CYCLOBENZAPRINE HCL 10 MG PO TABS: 10.0000 mg | ORAL_TABLET | Freq: Every day | ORAL | 0 refills | Status: DC

## 2020-06-15 NOTE — ED Triage Notes (Signed)
Pt presents with back pain after moving hay yesterday

## 2020-06-15 NOTE — Discharge Instructions (Signed)
Continue conservative management of rest, ice, heat, and gentle stretches/ massage Prednisone prescribed.  Take as directed and to completion Take cyclobenzaprine at nighttime for symptomatic relief. Avoid driving or operating heavy machinery while using medication. Follow up with PCP if symptoms persist Return or go to the ER if you have any new or worsening symptoms (fever, chills, chest pain, abdominal pain, changes in bowel or bladder habits, pain radiating into lower legs, etc...)

## 2020-06-15 NOTE — ED Provider Notes (Signed)
[facility]   [id number] 06/15/20 Arrival Time: 1056  CC: Back PAIN  SUBJECTIVE: History from: patient. Allison Sandoval is a 29 y.o. female complains of low back pain x few days.  Symptoms began after lifting a bale of hay.  Localizes the pain to the low back.  Describes the pain as intermittent and spasm in character.  Has tried OTC medications without relief.  Symptoms are made worse with leaning forward.  Denies similar symptoms in the past.  Denies fever, chills, erythema, ecchymosis, effusion, weakness, numbness and tingling, saddle paresthesias, loss of bowel or bladder function.      ROS: As per HPI.  All other pertinent ROS negative.     Past Medical History:  Diagnosis Date  . Seizures (HCC)    most recent 11/15/17   Past Surgical History:  Procedure Laterality Date  . ADENOIDECTOMY    . WISDOM TOOTH EXTRACTION     No Known Allergies No current facility-administered medications on file prior to encounter.   Current Outpatient Medications on File Prior to Encounter  Medication Sig Dispense Refill  . lamoTRIgine (LAMICTAL) 100 MG tablet Take 1 tablet (100 mg total) by mouth 2 (two) times daily. 180 tablet 3  . ORSYTHIA 0.1-20 MG-MCG tablet Take 1 tablet by mouth daily.  3  . promethazine (PHENERGAN) 25 MG tablet Take 1 tablet (25 mg total) by mouth every 6 (six) hours as needed for nausea or vomiting. 30 tablet 0  . [DISCONTINUED] fluticasone (FLONASE) 50 MCG/ACT nasal spray Place 1 spray into both nostrils daily for 14 days. 16 g 0   Social History   Socioeconomic History  . Marital status: Single    Spouse name: Not on file  . Number of children: 1  . Years of education: HS  . Highest education level: Not on file  Occupational History  . Occupation: Housekeeper  Tobacco Use  . Smoking status: Current Every Day Smoker    Packs/day: 1.00    Types: Cigarettes  . Smokeless tobacco: Never Used  . Tobacco comment: 11/18/17 1/2 ppd  Substance and Sexual  Activity  . Alcohol use: Yes    Comment: occasionally  . Drug use: No  . Sexual activity: Not on file  Other Topics Concern  . Not on file  Social History Narrative   Lives at home with her fiance and daughter.   Right-handed.   2 cups caffeine per day.   Social Determinants of Health   Financial Resource Strain:   . Difficulty of Paying Living Expenses:   Food Insecurity:   . Worried About Programme researcher, broadcasting/film/video in the Last Year:   . Barista in the Last Year:   Transportation Needs:   . Freight forwarder (Medical):   Marland Kitchen Lack of Transportation (Non-Medical):   Physical Activity:   . Days of Exercise per Week:   . Minutes of Exercise per Session:   Stress:   . Feeling of Stress :   Social Connections:   . Frequency of Communication with Friends and Family:   . Frequency of Social Gatherings with Friends and Family:   . Attends Religious Services:   . Active Member of Clubs or Organizations:   . Attends Banker Meetings:   Marland Kitchen Marital Status:   Intimate Partner Violence:   . Fear of Current or Ex-Partner:   . Emotionally Abused:   Marland Kitchen Physically Abused:   . Sexually Abused:    Family History  Problem  Relation Age of Onset  . Hypertension Mother   . Healthy Father     OBJECTIVE:  Vitals:   06/15/20 1108  BP: 113/79  Pulse: 97  Resp: 17  Temp: 98.4 F (36.9 C)  TempSrc: Oral  SpO2: 96%    General appearance: ALERT; in no acute distress.  Head: NCAT Lungs: Normal respiratory effort Musculoskeletal: Back Inspection: Skin warm, dry, clear and intact without obvious erythema, effusion, or ecchymosis.  Palpation: TTP over low back, paravertebral muscles ROM: FROM active and passive Strength: 5/5 shld abduction, 5/5 shld adduction, 5/5 elbow flexion, 5/5 elbow extension, 5/5 grip strength, 5/5 hip flexion, 5/5 hip extension Skin: warm and dry Neurologic: Ambulates without difficulty; Sensation intact about the upper/ lower  extremities Psychological: alert and cooperative; normal mood and affect   ASSESSMENT & PLAN:  1. Acute bilateral low back pain without sciatica     Meds ordered this encounter  Medications  . predniSONE (DELTASONE) 20 MG tablet    Sig: Take 1 tablet (20 mg total) by mouth 2 (two) times daily with a meal for 5 days.    Dispense:  10 tablet    Refill:  0    Order Specific Question:   Supervising Provider    Answer:   Eustace Moore [[id number]]  . cyclobenzaprine (FLEXERIL) 10 MG tablet    Sig: Take 1 tablet (10 mg total) by mouth at bedtime.    Dispense:  15 tablet    Refill:  0    Order Specific Question:   Supervising Provider    Answer:   Eustace Moore [[id number]]    Continue conservative management of rest, ice, heat, and gentle stretches/ massage Prednisone prescribed.  Take as directed and to completion Take cyclobenzaprine at nighttime for symptomatic relief. Avoid driving or operating heavy machinery while using medication. Follow up with PCP if symptoms persist Return or go to the ER if you have any new or worsening symptoms (fever, chills, chest pain, abdominal pain, changes in bowel or bladder habits, pain radiating into lower legs, etc...)    Reviewed expectations re: course of current medical issues. Questions answered. Outlined signs and symptoms indicating need for more acute intervention. Patient verbalized understanding. After Visit Summary given.    Rennis Harding, PA-C 06/15/20 1210

## 2020-07-04 ENCOUNTER — Ambulatory Visit
Admission: EM | Admit: 2020-07-04 | Discharge: 2020-07-04 | Disposition: A | Discharge status: Home / Self Care | Payer: Medicaid Other | Attending: Emergency Medicine | Admitting: Emergency Medicine

## 2020-07-04 ENCOUNTER — Other Ambulatory Visit: Payer: Self-pay

## 2020-07-04 DIAGNOSIS — Z20822 Contact with and (suspected) exposure to covid-19: Secondary | ICD-10-CM

## 2020-07-06 LAB — NOVEL CORONAVIRUS, NAA: SARS-CoV-2, NAA: NOT DETECTED

## 2020-09-04 ENCOUNTER — Ambulatory Visit: Payer: BC Managed Care – PPO | Admitting: Neurology

## 2020-09-27 DIAGNOSIS — H5213 Myopia, bilateral: Secondary | ICD-10-CM | POA: Diagnosis not present

## 2020-10-26 ENCOUNTER — Ambulatory Visit
Admission: EM | Admit: 2020-10-26 | Discharge: 2020-10-26 | Disposition: A | Discharge status: Home / Self Care | Payer: BC Managed Care – PPO | Attending: Emergency Medicine | Admitting: Emergency Medicine

## 2020-10-26 ENCOUNTER — Other Ambulatory Visit: Payer: Self-pay

## 2020-10-26 DIAGNOSIS — Z1152 Encounter for screening for COVID-19: Secondary | ICD-10-CM | POA: Diagnosis not present

## 2020-10-26 NOTE — ED Triage Notes (Signed)
Pt said she was exposed to covid and took home test and  Was positive, now work is wanting a test to confirm

## 2020-10-28 LAB — NOVEL CORONAVIRUS, NAA: SARS-CoV-2, NAA: DETECTED — AB

## 2020-10-28 LAB — SARS-COV-2, NAA 2 DAY TAT

## 2020-10-29 ENCOUNTER — Telehealth: Payer: Self-pay | Admitting: Physician Assistant

## 2020-10-29 NOTE — Telephone Encounter (Signed)
Called to discuss with patient about Covid symptoms and the use of sotrovimab, bamlanivimab/etesevimab or casirivimab/imdevimab, a monoclonal antibody infusion for those with mild to moderate Covid symptoms and at a high risk of hospitalization.  Pt is qualified for this infusion at the [facility] due to; Specific high risk criteria : BMI > 25 and Other high risk medical condition per CDC:  smoker. unvaccinated.    Message left to call back our hotline [phone]. My chart message sent if active on Mychart.   Cline Crock PA-C

## 2020-12-25 ENCOUNTER — Ambulatory Visit (INDEPENDENT_AMBULATORY_CARE_PROVIDER_SITE_OTHER): Payer: BC Managed Care – PPO

## 2020-12-25 ENCOUNTER — Ambulatory Visit (HOSPITAL_COMMUNITY)
Admission: EM | Admit: 2020-12-25 | Discharge: 2020-12-25 | Disposition: A | Discharge status: Home / Self Care | Payer: BC Managed Care – PPO | Attending: Student | Admitting: Student

## 2020-12-25 ENCOUNTER — Other Ambulatory Visit: Payer: Self-pay

## 2020-12-25 ENCOUNTER — Encounter (HOSPITAL_COMMUNITY): Payer: Self-pay

## 2020-12-25 DIAGNOSIS — S93492A Sprain of other ligament of left ankle, initial encounter: Secondary | ICD-10-CM | POA: Diagnosis not present

## 2020-12-25 DIAGNOSIS — M25472 Effusion, left ankle: Secondary | ICD-10-CM

## 2020-12-25 DIAGNOSIS — M7989 Other specified soft tissue disorders: Secondary | ICD-10-CM | POA: Diagnosis not present

## 2020-12-25 DIAGNOSIS — X501XXA Overexertion from prolonged static or awkward postures, initial encounter: Secondary | ICD-10-CM | POA: Diagnosis not present

## 2020-12-25 DIAGNOSIS — M25572 Pain in left ankle and joints of left foot: Secondary | ICD-10-CM

## 2020-12-25 NOTE — ED Triage Notes (Signed)
Pt presents with injury to left ankle. Pt states she fell on her ankle yesterday. Pt states when putting pressure on the ankle it becomes painful and she is unable to walk. Pt states she feels relief when not moving her ankle. Pt states she cannot take pain pills.

## 2020-12-25 NOTE — Discharge Instructions (Addendum)
-  Keep your boot on while ambulating during the day -Follow-up with orthopedist in 2-3 days (information below). Call them today or tomorrow to schedule an appointment.

## 2020-12-25 NOTE — ED Provider Notes (Signed)
[facility]    CSN: [id number] Arrival date & time: 12/25/20  0801      History   Chief Complaint Chief Complaint  Patient presents with  . Fall  . Ankle Injury    HPI Allison Sandoval is a 30 y.o. female presenting for left ankle pain and swelling x1 day. History seizures, most recent 11/15/2017. She states she was helping move furniture at her parent's house and twisted her ankle. Describes inverting it and hearing a pop. Immediately felt pain but decided to see if it would feel better in the morning. She states she has two active jobs and so she wants to make sure nothing is broken, and she'll need a work note. Denies falling, injury elsewhere, recent seizure.  HPI  Past Medical History:  Diagnosis Date  . Seizures (HCC)    most recent 11/15/17    Patient Active Problem List   Diagnosis Date Noted  . Seizures (HCC) 10/23/2016    Past Surgical History:  Procedure Laterality Date  . ADENOIDECTOMY    . WISDOM TOOTH EXTRACTION      OB History   No obstetric history on file.      Home Medications    Prior to Admission medications   Medication Sig Start Date End Date Taking? Authorizing Provider  cyclobenzaprine (FLEXERIL) 10 MG tablet Take 1 tablet (10 mg total) by mouth at bedtime. 06/15/20   Wurst, [place], PA-C  lamoTRIgine (LAMICTAL) 100 MG tablet Take 1 tablet (100 mg total) by mouth 2 (two) times daily. 02/28/20   Glean Salvo, NP  ORSYTHIA 0.1-20 MG-MCG tablet Take 1 tablet by mouth daily. 03/30/16   [provider]  promethazine (PHENERGAN) 25 MG tablet Take 1 tablet (25 mg total) by mouth every 6 (six) hours as needed for nausea or vomiting. 12/29/17   Rancour, Jeannett Senior, MD  fluticasone (FLONASE) 50 MCG/ACT nasal spray Place 1 spray into both nostrils daily for 14 days. 05/24/20 06/15/20  Durward Parcel, FNP    Family History Family History  Problem Relation Age of Onset  . Hypertension Mother   . Healthy Father     Social  History Social History   Tobacco Use  . Smoking status: Current Every Day Smoker    Packs/day: 1.00    Types: Cigarettes  . Smokeless tobacco: Never Used  . Tobacco comment: 11/18/17 1/2 ppd  Substance Use Topics  . Alcohol use: Yes    Comment: occasionally  . Drug use: No     Allergies   Patient has no known allergies.   Review of Systems Review of Systems  Musculoskeletal:       Left ankle pain and swelling  All other systems reviewed and are negative.    Physical Exam Triage Vital Signs ED Triage Vitals  Enc Vitals Group     BP 12/25/20 0813 124/80     Pulse Rate 12/25/20 0813 (!) 120     Resp 12/25/20 0813 17     Temp 12/25/20 0813 98 F (36.7 C)     Temp Source 12/25/20 0813 Oral     SpO2 12/25/20 0813 98 %     Weight --      Height --      Head Circumference --      Peak Flow --      Pain Score 12/25/20 0810 10     Pain Loc --      Pain Edu? --      Excl. in  GC? --    No data found.  Updated Vital Signs BP 124/80 (BP Location: Right Arm)   Pulse (!) 120   Temp 98 F (36.7 C) (Oral)   Resp 17   LMP 11/25/2020 (Exact Date)   SpO2 98%   Visual Acuity Right Eye Distance:   Left Eye Distance:   Bilateral Distance:    Right Eye Near:   Left Eye Near:    Bilateral Near:     Physical Exam Vitals reviewed.  Constitutional:      Appearance: Normal appearance.  HENT:     Head: Normocephalic and atraumatic.  Cardiovascular:     Rate and Rhythm: Regular rhythm. Tachycardia present.     Heart sounds: Normal heart sounds.  Pulmonary:     Effort: Pulmonary effort is normal.     Breath sounds: Normal breath sounds.  Musculoskeletal:     Comments: L ankle with swelling and tenderness overlying lateral malleolus. ROM ankle reduced due to pain, ROM toes intact. DP 2+, cap refill <2 seconds. Neurovascularly intact. No other injury/deformity/pain.  Neurological:     General: No focal deficit present.     Mental Status: She is alert and oriented to  person, place, and time.  Psychiatric:        Mood and Affect: Mood normal.        Behavior: Behavior normal.        Thought Content: Thought content normal.        Judgment: Judgment normal.      UC Treatments / Results  Labs (all labs ordered are listed, but only abnormal results are displayed) Labs Reviewed - No data to display  EKG   Radiology DG Ankle Complete Left  Result Date: 12/25/2020 CLINICAL DATA:  Rolled ankle. pain and swelling over lateral malleolus EXAM: LEFT ANKLE COMPLETE - 3+ VIEW COMPARISON:  None. FINDINGS: Normal alignment with approximation of the joints. Small osseous fragments along the inferior midfoot. There is no evidence of arthropathy. Soft tissue swelling with small joint effusion. IMPRESSION: Small osseous fragments along the inferior midfoot may reflect age indeterminate posttraumatic sequela. Soft tissue swelling and small joint effusion. Electronically Signed   By: Stana Bunting M.D.   On: 12/25/2020 08:52    Procedures Procedures (including critical care time)  Medications Ordered in UC Medications - No data to display  Initial Impression / Assessment and Plan / UC Course  I have reviewed the triage vital signs and the nursing notes.  Pertinent labs & imaging results that were available during my care of the patient were reviewed by me and considered in my medical decision making (see chart for details).      This patient is a 30 year old female presenting with inversion injury to L ankle.   Xray L ankle:  Small osseous fragments along the inferior midfoot may reflect age indeterminate posttraumatic sequela. Soft tissue swelling and small joint effusion.  Results discussed with patient. CAM boot placed. Advised close follow-up with ortho; she will call today to schedule follow-up in the next 2-3 days.   This patient has multiple jobs requiring heavy lifting; work note provided for 3 days. Discussed she may need to consider  short-term disability but I cannot provide her with this; follow-up with PCP or occupational therapy.  Spent over 40 minutes obtaining H&P, performing physical, interpreting films, discussing results, treatment plan and plan for follow-up with patient. Patient agrees with plan.      Final Clinical Impressions(s) / UC Diagnoses  Final diagnoses:  Sprain of anterior talofibular ligament of left ankle, initial encounter     Discharge Instructions     -Keep your boot on while ambulating during the day -Follow-up with orthopedist in 2-3 days (information below). Call them today or tomorrow to schedule an appointment.     ED Prescriptions    None     PDMP not reviewed this encounter.   Rhys Martini, PA-C 12/25/20 [phone]

## 2021-01-08 ENCOUNTER — Ambulatory Visit (INDEPENDENT_AMBULATORY_CARE_PROVIDER_SITE_OTHER): Payer: BC Managed Care – PPO | Admitting: Sports Medicine

## 2021-01-08 ENCOUNTER — Other Ambulatory Visit: Payer: Self-pay

## 2021-01-08 VITALS — BP 104/68 | Ht 63.0 in | Wt 140.0 lb

## 2021-01-08 DIAGNOSIS — S93402A Sprain of unspecified ligament of left ankle, initial encounter: Secondary | ICD-10-CM | POA: Diagnosis not present

## 2021-01-08 DIAGNOSIS — S93492A Sprain of other ligament of left ankle, initial encounter: Secondary | ICD-10-CM

## 2021-01-08 NOTE — Progress Notes (Signed)
   Subjective:    Patient ID: Allison Sandoval, female    DOB: June 15, 1991, 30 y.o.   MRN: [id number]  HPI chief complaint: Left ankle pain  Very pleasant 30 year old female comes in today after having injured her left ankle.  While helping to move furniture, she suffered an inversion injury to the ankle.  She developed immediate pain and swelling and was seen at an urgent care on February 21.  X-rays were obtained and she was placed into a cam walker.  She works for [employer] and has been out of work since her injury.  Overall, she is improving.  All of her pain is localized to the lateral ankle.  No pain at the base of the fifth metatarsal.  She denies any medial ankle pain.  She denies any prior injury to this ankle.  Past medical history reviewed Medications reviewed Allergies reviewed    Review of Systems    As above Objective:   Physical Exam  Developed, well nourished.  No acute distress  Left ankle: There is limited range of motion in all planes.  Mild soft tissue swelling along the lateral ankle.  Tenderness to palpation over the ATF and calcaneofibular ligaments.  Positive anterior drawer, negative talar tilt.  No tenderness over the medial malleolus.  No tenderness at the navicular.  No tenderness to palpation at the base of the fifth metatarsal.  Good pulses.  She is able to ambulate with minimal limping.  X-rays including AP, lateral, and oblique views show nothing acute.      Assessment & Plan:   Left ankle pain secondary to ankle sprain  Patient will discontinue her cam walker in favor of a med spec lace up brace.  She will start range of motion and strengthening exercises and follow-up with me again in 2 weeks.  She will need to remain out of work at [employer] until follow-up with me at that time.  I will complete short-term disability paperwork for her and she will call with questions or concerns prior to her follow-up visit.

## 2021-01-22 ENCOUNTER — Other Ambulatory Visit: Payer: Self-pay

## 2021-01-22 ENCOUNTER — Ambulatory Visit (INDEPENDENT_AMBULATORY_CARE_PROVIDER_SITE_OTHER): Payer: BC Managed Care – PPO | Admitting: Sports Medicine

## 2021-01-22 VITALS — BP 110/70 | Ht 63.0 in | Wt 140.0 lb

## 2021-01-22 DIAGNOSIS — S93492D Sprain of other ligament of left ankle, subsequent encounter: Secondary | ICD-10-CM | POA: Diagnosis not present

## 2021-01-22 NOTE — Progress Notes (Signed)
   Subjective:    Patient ID: Allison Sandoval, female    DOB: 1991/10/03, 30 y.o.   MRN: [id number]  HPI   Ivana comes in today for follow-up on a left ankle sprain.  She is improving but is not 100%.  Pain and swelling have improved but she did "tweak" the ankle again a couple of days ago.  She has been wearing her med spec brace.  She has also been doing her home exercises.  She did try to squeeze her foot and ankle into a steel toed boot which she has to wear at work but found this to be pretty uncomfortable.  Swelling is improving.  All of her pain is localized to the lateral ankle.  No pain medially.   Review of Systems As above    Objective:   Physical Exam  Well-developed, well-nourished.  No acute distress  Left ankle: Good range of motion.  Mild residual soft tissue swelling.  She is tender to palpation over the ATF.  Mildly positive anterior drawer, 1+ talar tilt.  No tenderness over the distal fibula.  No tenderness over the medial malleolus.  Good pulses.  She is able to bear weight but walks with a slight limp.      Assessment & Plan:   Improving left ankle pain secondary to ankle sprain  Although her symptoms are improving, she is not quite ready to return to work.  Her job at [employer] does not allow restrictions so she will need to be 100% before returning.  She will continue in her med spec brace and we will add proprioception exercises to her range of motion and strengthening exercises.  She will return to the office on April 14 for reevaluation.  Hopefully we can plan on returning her to work at that time.

## 2021-02-15 ENCOUNTER — Ambulatory Visit: Payer: BC Managed Care – PPO | Admitting: Sports Medicine

## 2021-02-19 ENCOUNTER — Ambulatory Visit (INDEPENDENT_AMBULATORY_CARE_PROVIDER_SITE_OTHER): Payer: BC Managed Care – PPO | Admitting: Sports Medicine

## 2021-02-19 ENCOUNTER — Other Ambulatory Visit: Payer: Self-pay

## 2021-02-19 VITALS — BP 110/80 | Ht 63.0 in | Wt 140.0 lb

## 2021-02-19 DIAGNOSIS — S93492D Sprain of other ligament of left ankle, subsequent encounter: Secondary | ICD-10-CM

## 2021-02-20 NOTE — Progress Notes (Signed)
   Subjective:    Patient ID: Allison Sandoval, female    DOB: 06-21-1991, 30 y.o.   MRN: [id number]  HPI   Chau comes in today for follow-up on left ankle pain secondary to a lateral ankle sprain.  She continues to improve, albeit slowly.  She still endorses pain along the lateral ankle with inversion and she is still unable to put her foot into a steel toed boot.  She has been doing her exercises at home.  Previous x-rays showed no obvious fracture.  She remains out of work.   Review of Systems As above    Objective:   Physical Exam  Well-developed, well-nourished.  No acute distress  Left ankle: Full range of motion.  There is still some residual lateral soft tissue swelling.  She is also tender to palpation along the anterior lateral joint line.  1+ anterior drawer, 1+ talar tilt.  No tenderness along the medial malleolus.  Neurovascularly intact distally.      Assessment & Plan:   Persistent left ankle pain status post ankle sprain-rule out OCD  Although the patient continues to improve, I would expect her to be much more functional at this point with a simple ankle sprain.  Therefore, I would like to get an MRI of her left ankle specifically to rule out an osteochondral defect that may be responsible for her persistent pain.  Phone follow-up with those results when available.  If unremarkable, consider formal physical therapy and a return to work.  She will remain out of work until follow-up with me again in 2 weeks.

## 2021-03-16 ENCOUNTER — Other Ambulatory Visit: Payer: BC Managed Care – PPO

## 2021-03-27 ENCOUNTER — Other Ambulatory Visit: Payer: Self-pay

## 2021-03-27 ENCOUNTER — Ambulatory Visit
Admission: EM | Admit: 2021-03-27 | Discharge: 2021-03-27 | Disposition: A | Discharge status: Home / Self Care | Payer: BC Managed Care – PPO

## 2021-03-27 DIAGNOSIS — J0140 Acute pansinusitis, unspecified: Secondary | ICD-10-CM | POA: Diagnosis not present

## 2021-03-27 MED ORDER — AMOXICILLIN-POT CLAVULANATE 875-125 MG PO TABS: 1.0000 | ORAL_TABLET | Freq: Two times a day (BID) | ORAL | 0 refills | Status: AC

## 2021-03-27 NOTE — Discharge Instructions (Signed)
I have sent in Augmentin for you to take twice a day for 7 days.  Follow up with this office or with primary care if symptoms are persisting.  Follow up in the ER for high fever, trouble swallowing, trouble breathing, other concerning symptoms.  

## 2021-03-27 NOTE — ED Provider Notes (Addendum)
[facility]    CSN: [id number] Arrival date & time: 03/27/21  0908      History   Chief Complaint Chief Complaint  Patient presents with  . Sore Throat  . Nasal Congestion    HPI Allison Sandoval is a 30 y.o. female.   Reports sinus pressure, sore throat, nasal congestion with cough for the last week.  Has been taking Sudafed with no relief.  Denies sick contacts.  Has negative history of COVID.  Is not completed flu vaccines.  Has not pleated COVID vaccines.  Denies abdominal pain, nausea, vomiting, diarrhea, rash, fever, other symptoms.  ROS per HPI    Sore Throat    Past Medical History:  Diagnosis Date  . Seizures (HCC)    most recent 11/15/17    Patient Active Problem List   Diagnosis Date Noted  . Seizures (HCC) 10/23/2016    Past Surgical History:  Procedure Laterality Date  . ADENOIDECTOMY    . WISDOM TOOTH EXTRACTION      OB History   No obstetric history on file.      Home Medications    Prior to Admission medications   Medication Sig Start Date End Date Taking? Authorizing Provider  amoxicillin-clavulanate (AUGMENTIN) 875-125 MG tablet Take 1 tablet by mouth 2 (two) times daily for 7 days. 03/27/21 04/03/21 Yes Moshe Cipro, NP  pseudoephedrine (SUDAFED) 30 MG tablet Take 30 mg by mouth every 4 (four) hours as needed for congestion.   Yes [provider]  cyclobenzaprine (FLEXERIL) 10 MG tablet Take 1 tablet (10 mg total) by mouth at bedtime. 06/15/20   Wurst, [place], PA-C  lamoTRIgine (LAMICTAL) 100 MG tablet Take 1 tablet (100 mg total) by mouth 2 (two) times daily. 02/28/20   Glean Salvo, NP  ORSYTHIA 0.1-20 MG-MCG tablet Take 1 tablet by mouth daily. 03/30/16   [provider]  promethazine (PHENERGAN) 25 MG tablet Take 1 tablet (25 mg total) by mouth every 6 (six) hours as needed for nausea or vomiting. 12/29/17   Rancour, Jeannett Senior, MD  fluticasone (FLONASE) 50 MCG/ACT nasal spray Place 1 spray into both  nostrils daily for 14 days. 05/24/20 06/15/20  Durward Parcel, FNP    Family History Family History  Problem Relation Age of Onset  . Hypertension Mother   . Healthy Father     Social History Social History   Tobacco Use  . Smoking status: Current Every Day Smoker    Packs/day: 1.00    Types: Cigarettes  . Smokeless tobacco: Never Used  . Tobacco comment: 11/18/17 1/2 ppd  Substance Use Topics  . Alcohol use: Yes    Comment: occasionally  . Drug use: No     Allergies   Patient has no known allergies.   Review of Systems Review of Systems   Physical Exam Triage Vital Signs ED Triage Vitals  Enc Vitals Group     BP 03/27/21 1046 123/83     Pulse Rate 03/27/21 1046 94     Resp 03/27/21 1046 17     Temp 03/27/21 1046 98.5 F (36.9 C)     Temp Source 03/27/21 1046 Oral     SpO2 03/27/21 1046 96 %     Weight --      Height --      Head Circumference --      Peak Flow --      Pain Score 03/27/21 1044 8     Pain Loc --  Pain Edu? --      Excl. in GC? --    No data found.  Updated Vital Signs BP 123/83 (BP Location: Right Arm)   Pulse 94   Temp 98.5 F (36.9 C) (Oral)   Resp 17   SpO2 96%   Visual Acuity Right Eye Distance:   Left Eye Distance:   Bilateral Distance:    Right Eye Near:   Left Eye Near:    Bilateral Near:     Physical Exam Vitals and nursing note reviewed.  Constitutional:      General: She is not in acute distress.    Appearance: Normal appearance. She is well-developed and normal weight. She is ill-appearing.  HENT:     Head: Normocephalic and atraumatic.     Right Ear: Tympanic membrane, ear canal and external ear normal.     Left Ear: Tympanic membrane, ear canal and external ear normal.     Nose: Congestion present.     Comments: Frontal and maxillary sinus tenderness    Mouth/Throat:     Mouth: Mucous membranes are moist.     Pharynx: Posterior oropharyngeal erythema present.  Eyes:     Extraocular Movements:  Extraocular movements intact.     Conjunctiva/sclera: Conjunctivae normal.     Pupils: Pupils are equal, round, and reactive to light.  Cardiovascular:     Rate and Rhythm: Normal rate and regular rhythm.     Heart sounds: Normal heart sounds. No murmur heard.   Pulmonary:     Effort: Pulmonary effort is normal. No respiratory distress.     Breath sounds: Normal breath sounds. No stridor. No wheezing, rhonchi or rales.  Chest:     Chest wall: No tenderness.  Abdominal:     General: Bowel sounds are normal. There is no distension.     Palpations: Abdomen is soft. There is no mass.     Tenderness: There is no abdominal tenderness. There is no right CVA tenderness, left CVA tenderness, guarding or rebound.     Hernia: No hernia is present.  Musculoskeletal:        General: Normal range of motion.     Cervical back: Normal range of motion and neck supple.  Lymphadenopathy:     Cervical: Cervical adenopathy present.  Skin:    General: Skin is warm and dry.     Capillary Refill: Capillary refill takes less than 2 seconds.  Neurological:     General: No focal deficit present.     Mental Status: She is alert and oriented to person, place, and time.  Psychiatric:        Mood and Affect: Mood normal.        Behavior: Behavior normal.        Thought Content: Thought content normal.      UC Treatments / Results  Labs (all labs ordered are listed, but only abnormal results are displayed) Labs Reviewed - No data to display  EKG   Radiology No results found.  Procedures Procedures (including critical care time)  Medications Ordered in UC Medications - No data to display  Initial Impression / Assessment and Plan / UC Course  I have reviewed the triage vital signs and the nursing notes.  Pertinent labs & imaging results that were available during my care of the patient were reviewed by me and considered in my medical decision making (see chart for details).     Pansinusitis  Augmentin 875 mg twice daily x7 days prescribed May  take ibuprofen and Tylenol as needed Push fluids and get plenty of rest Work note provided Follow up with this office or with primary care if symptoms are persisting.  Follow up in the ER for high fever, trouble swallowing, trouble breathing, other concerning symptoms.   Final Clinical Impressions(s) / UC Diagnoses   Final diagnoses:  Acute non-recurrent pansinusitis     Discharge Instructions     I have sent in Augmentin for you to take twice a day for 7 days.  Follow up with this office or with primary care if symptoms are persisting.  Follow up in the ER for high fever, trouble swallowing, trouble breathing, other concerning symptoms.     ED Prescriptions    Medication Sig Dispense Auth. Provider   amoxicillin-clavulanate (AUGMENTIN) 875-125 MG tablet Take 1 tablet by mouth 2 (two) times daily for 7 days. 14 tablet Moshe Cipro, NP     PDMP not reviewed this encounter.   Moshe Cipro, NP 04/01/21 8921    Moshe Cipro, NP 04/01/21 [phone]

## 2021-03-27 NOTE — ED Triage Notes (Signed)
Pt reports sore throat, sinus pressure and nasal congestion x 1 week. Sudafed gives no  relief. Denies fever.

## 2021-05-14 ENCOUNTER — Other Ambulatory Visit: Payer: Self-pay | Admitting: Neurology

## 2021-06-04 ENCOUNTER — Telehealth: Payer: BC Managed Care – PPO | Admitting: Neurology

## 2021-08-03 ENCOUNTER — Emergency Department (HOSPITAL_COMMUNITY)
Admission: EM | Admit: 2021-08-03 | Discharge: 2021-08-04 | Disposition: A | Discharge status: Home / Self Care | Payer: Medicaid Other | Attending: Emergency Medicine | Admitting: Emergency Medicine

## 2021-08-03 ENCOUNTER — Other Ambulatory Visit: Payer: Self-pay

## 2021-08-03 ENCOUNTER — Encounter (HOSPITAL_COMMUNITY): Payer: Self-pay

## 2021-08-03 DIAGNOSIS — K602 Anal fissure, unspecified: Secondary | ICD-10-CM | POA: Diagnosis not present

## 2021-08-03 DIAGNOSIS — F17210 Nicotine dependence, cigarettes, uncomplicated: Secondary | ICD-10-CM | POA: Insufficient documentation

## 2021-08-03 MED ORDER — DOCUSATE SODIUM 100 MG PO CAPS
200.0000 mg | ORAL_CAPSULE | Freq: Once | ORAL | Status: AC
  Administered 2021-08-04: 200 mg via ORAL
  Filled 2021-08-03: qty 2

## 2021-08-03 MED ORDER — LIDOCAINE HCL URETHRAL/MUCOSAL 2 % EX GEL
1.0000 "application " | Freq: Once | CUTANEOUS | Status: AC
  Administered 2021-08-03: 1 via TOPICAL
  Filled 2021-08-03: qty 10

## 2021-08-03 MED ORDER — LIDOCAINE 2 % EX GEL: 1.0000 "application " | Freq: Four times a day (QID) | CUTANEOUS | 0 refills | Status: AC | PRN

## 2021-08-03 MED ORDER — NITROGLYCERIN 0.4 % RE OINT: 1.0000 "application " | TOPICAL_OINTMENT | Freq: Two times a day (BID) | RECTAL | 0 refills | Status: AC

## 2021-08-03 MED ORDER — DOCUSATE SODIUM 250 MG PO CAPS: 250.0000 mg | ORAL_CAPSULE | Freq: Every day | ORAL | 0 refills | Status: AC

## 2021-08-03 MED ORDER — LIDOCAINE VISCOUS HCL 2 % MT SOLN: 15.0000 mL | Freq: Once | OROMUCOSAL | Status: DC

## 2021-08-03 NOTE — ED Provider Notes (Signed)
[facility] EMERGENCY DEPARTMENT Provider Note   CSN: [id number] Arrival date & time: 08/03/21  2124     History No chief complaint on file.   Allison Sandoval is a 30 y.o. female.  HPI     30 year old female comes in with chief complaint of anal pain.  Patient reports that she rushed home because she was wanting to defecate.  Once home she had a loose, diarrhea type BM.  Thereafter she felt like she had to go but there was no output, so she sat for another 15 minutes on the commode.  Patient started having a feeling like her rectum was being ripped.  She applied hemorrhoid cream without significant help.  She has taken warm sitz bath and that were helping.  Denies any kind of anal trauma.  There is no family history of IBD.  Mother has IBS.  Patient does not have any history of chronic constipation and is fairly regular.  No bloody stools.  Past Medical History:  Diagnosis Date   Seizures (HCC)    most recent 11/15/17    Patient Active Problem List   Diagnosis Date Noted   Seizures (HCC) 10/23/2016    Past Surgical History:  Procedure Laterality Date   ADENOIDECTOMY     WISDOM TOOTH EXTRACTION       OB History   No obstetric history on file.     Family History  Problem Relation Age of Onset   Hypertension Mother    Healthy Father     Social History   Tobacco Use   Smoking status: Every Day    Packs/day: 1.00    Types: Cigarettes   Smokeless tobacco: Never   Tobacco comments:    11/18/17 1/2 ppd  Substance Use Topics   Alcohol use: Yes    Comment: occasionally   Drug use: No    Home Medications Prior to Admission medications   Medication Sig Start Date End Date Taking? Authorizing Provider  docusate sodium (COLACE) 250 MG capsule Take 1 capsule (250 mg total) by mouth daily. 08/03/21  Yes Lamorris Knoblock, MD  Lidocaine 2 % GEL Apply 1 application topically 4 (four) times daily as needed. 08/03/21  Yes Derwood Kaplan, MD  Nitroglycerin 0.4 % OINT  Place 1 application rectally in the morning and at bedtime for 7 days. 08/03/21 08/10/21 Yes Derwood Kaplan, MD  cyclobenzaprine (FLEXERIL) 10 MG tablet Take 1 tablet (10 mg total) by mouth at bedtime. 06/15/20   Wurst, [place], PA-C  lamoTRIgine (LAMICTAL) 100 MG tablet Take 1 tablet by mouth twice daily 05/15/21   Glean Salvo, NP  ORSYTHIA 0.1-20 MG-MCG tablet Take 1 tablet by mouth daily. 03/30/16   [provider]  promethazine (PHENERGAN) 25 MG tablet Take 1 tablet (25 mg total) by mouth every 6 (six) hours as needed for nausea or vomiting. 12/29/17   Rancour, Jeannett Senior, MD  pseudoephedrine (SUDAFED) 30 MG tablet Take 30 mg by mouth every 4 (four) hours as needed for congestion.    [provider]  fluticasone (FLONASE) 50 MCG/ACT nasal spray Place 1 spray into both nostrils daily for 14 days. 05/24/20 06/15/20  Durward Parcel, FNP    Allergies    Patient has no known allergies.  Review of Systems   Review of Systems  Constitutional:  Positive for activity change.  Gastrointestinal:  Negative for anal bleeding, blood in stool, constipation, nausea and vomiting.  Skin:  Negative for rash.  Allergic/Immunologic: Negative for immunocompromised state.  All  other systems reviewed and are negative.  Physical Exam Updated Vital Signs BP 98/82 (BP Location: Right Arm)   Pulse (!) 112   Temp 98.3 F (36.8 C) (Oral)   Resp 18   Ht 5\' 3"  (1.6 m)   Wt 61.7 kg   LMP 07/03/2021   SpO2 97%   BMI 24.09 kg/m   Physical Exam Vitals and nursing note reviewed.  Constitutional:      Appearance: She is well-developed.  HENT:     Head: Atraumatic.  Cardiovascular:     Rate and Rhythm: Normal rate.  Pulmonary:     Effort: Pulmonary effort is normal.  Genitourinary:    Comments: GU exam was performed with chaperone.  There is no evidence of hemorrhoids.  Patient does have small induration superior and lateral to the anus.  There is tenderness to palpation of those region.   No bleeding.  Digital rectal exam was uncomfortable but there was no evidence of perirectal abscess, fluctuance. Musculoskeletal:     Cervical back: Normal range of motion and neck supple.  Skin:    General: Skin is warm and dry.  Neurological:     Mental Status: She is alert and oriented to person, place, and time.    ED Results / Procedures / Treatments   Labs (all labs ordered are listed, but only abnormal results are displayed) Labs Reviewed - No data to display  EKG None  Radiology No results found.  Procedures Procedures   Medications Ordered in ED Medications  docusate sodium (COLACE) capsule 200 mg (has no administration in time range)  lidocaine (XYLOCAINE) 2 % jelly 1 application (1 application Topical Given 08/03/21 2327)    ED Course  I have reviewed the triage vital signs and the nursing notes.  Pertinent labs & imaging results that were available during my care of the patient were reviewed by me and considered in my medical decision making (see chart for details).    MDM Rules/Calculators/A&P                           Patient comes in with new onset anal pain that started today.  No history of similar issues in the past.  Denies any constipation. On exam there is no anal drainage.  She denies any pruritus.  No evidence of prolapsed rectum, hemorrhoids.  She has some discomfort with palpation around her anal canal.  DRE did not reveal a mass and no evidence of rectal abscess.  Provisional diagnosis is that of anal fissure versus anal spasms. We will treat her with antispasmodic and pain medication that is both topical. Stool softeners and sitz bath recommended.   Final Clinical Impression(s) / ED Diagnoses Final diagnoses:  Anal fissure    Rx / DC Orders ED Discharge Orders          Ordered    Lidocaine 2 % GEL  4 times daily PRN        08/03/21 2338    Nitroglycerin 0.4 % OINT  2 times daily        08/03/21 2338    docusate sodium (COLACE) 250  MG capsule  Daily        08/03/21 2338             [id number], MD 08/03/21 2346

## 2021-08-03 NOTE — Discharge Instructions (Addendum)
Our initial diagnosis that you have anal fissures or anal spasm. The treatment for both is topical pain medication and antispasmodic topical medication which have been prescribed. Sitz bath for 15 minutes 2-3 times a day are also recommended.  Stool softeners have been prescribed.  Please have your primary care doctor reassess you in about 2 weeks. Return to the ER if your symptoms get worse.

## 2021-08-03 NOTE — ED Triage Notes (Signed)
Pt. States they are having diarrhea. Pt. States their rectum feels like the ripped. Pt. States they used a Hemorid creme and took a warm bath. Per pt. The warm bath is the only thing that helped.

## 2021-08-06 ENCOUNTER — Telehealth: Payer: Self-pay

## 2021-08-06 NOTE — Telephone Encounter (Signed)
Transition Care Management Unsuccessful Follow-up Telephone Call  Date of discharge and from where:  08/04/2021-[place]  Attempts:  1st Attempt  Reason for unsuccessful TCM follow-up call:  Left voice message    

## 2021-08-07 NOTE — Telephone Encounter (Signed)
Transition Care Management Unsuccessful Follow-up Telephone Call  Date of discharge and from where:  08/04/2021 from [place]  Attempts:  2nd Attempt  Reason for unsuccessful TCM follow-up call:  Left voice message    

## 2021-08-08 NOTE — Telephone Encounter (Signed)
Transition Care Management Unsuccessful Follow-up Telephone Call  Date of discharge and from where:  08/04/2021 from [facility]   Attempts:  3rd Attempt  Reason for unsuccessful TCM follow-up call:  Unable to reach patient

## 2021-08-20 ENCOUNTER — Ambulatory Visit
Admission: EM | Admit: 2021-08-20 | Discharge: 2021-08-20 | Disposition: A | Discharge status: Home / Self Care | Payer: Medicaid Other | Attending: Student | Admitting: Student

## 2021-08-20 ENCOUNTER — Encounter: Payer: Self-pay | Admitting: Emergency Medicine

## 2021-08-20 ENCOUNTER — Telehealth: Payer: Medicaid Other

## 2021-08-20 ENCOUNTER — Other Ambulatory Visit: Payer: Self-pay

## 2021-08-20 DIAGNOSIS — J0101 Acute recurrent maxillary sinusitis: Secondary | ICD-10-CM

## 2021-08-20 MED ORDER — ALBUTEROL SULFATE HFA 108 (90 BASE) MCG/ACT IN AERS: 1.0000 | INHALATION_SPRAY | Freq: Four times a day (QID) | RESPIRATORY_TRACT | 0 refills | Status: AC | PRN

## 2021-08-20 MED ORDER — AMOXICILLIN 875 MG PO TABS: 875.0000 mg | ORAL_TABLET | Freq: Two times a day (BID) | ORAL | 0 refills | Status: AC

## 2021-08-20 MED ORDER — BENZONATATE 100 MG PO CAPS: 100.0000 mg | ORAL_CAPSULE | Freq: Three times a day (TID) | ORAL | 0 refills | Status: DC

## 2021-08-20 NOTE — Discharge Instructions (Addendum)
-  Amoxicillin twice daily x7 days -Tessalon (Benzonatate) as needed for cough. Take one pill up to 3x daily (every 8 hours) -Albuterol inhaler as needed for cough, wheezing, shortness of breath, 1 to 2 puffs every 6 hours as needed. -With a virus, you're typically contagious for 5-7 days, or as long as you're having fevers.

## 2021-08-20 NOTE — ED Provider Notes (Signed)
[facility]    CSN: [id number] Arrival date & time: 08/20/21  1031      History   Chief Complaint No chief complaint on file.   HPI Allison Sandoval is a 30 y.o. female presenting with viral symptoms for about 4 days.  Medical history recurrent sinusitis per patient.  Describes cough productive of yellow sputum, purulent green nasal congestion, facial pressure.  States that she deals with recurrent sinusitis and has been told that she should see an ear nose and throat doctor, though this has not happened yet.  She states she does not have COVID.OTC medications providing minimal relief.  Denies fever/chills.  HPI  Past Medical History:  Diagnosis Date   Seizures (HCC)    most recent 11/15/17    Patient Active Problem List   Diagnosis Date Noted   Seizures (HCC) 10/23/2016    Past Surgical History:  Procedure Laterality Date   ADENOIDECTOMY     WISDOM TOOTH EXTRACTION      OB History   No obstetric history on file.      Home Medications    Prior to Admission medications   Medication Sig Start Date End Date Taking? Authorizing Provider  albuterol (VENTOLIN HFA) 108 (90 Base) MCG/ACT inhaler Inhale 1-2 puffs into the lungs every 6 (six) hours as needed for wheezing or shortness of breath. 08/20/21  Yes Rhys Martini, PA-C  amoxicillin (AMOXIL) 875 MG tablet Take 1 tablet (875 mg total) by mouth 2 (two) times daily for 7 days. 08/20/21 08/27/21 Yes Rhys Martini, PA-C  benzonatate (TESSALON) 100 MG capsule Take 1 capsule (100 mg total) by mouth every 8 (eight) hours. 08/20/21  Yes Rhys Martini, PA-C  docusate sodium (COLACE) 250 MG capsule Take 1 capsule (250 mg total) by mouth daily. 08/03/21   Derwood Kaplan, MD  lamoTRIgine (LAMICTAL) 100 MG tablet Take 1 tablet by mouth twice daily 05/15/21   Glean Salvo, NP  Lidocaine 2 % GEL Apply 1 application topically 4 (four) times daily as needed. 08/03/21   Derwood Kaplan, MD  Nitroglycerin 0.4 % OINT  Place 1 application rectally in the morning and at bedtime for 7 days. 08/03/21 08/10/21  Derwood Kaplan, MD  ORSYTHIA 0.1-20 MG-MCG tablet Take 1 tablet by mouth daily. 03/30/16   [provider]  fluticasone (FLONASE) 50 MCG/ACT nasal spray Place 1 spray into both nostrils daily for 14 days. 05/24/20 06/15/20  Durward Parcel, FNP    Family History Family History  Problem Relation Age of Onset   Hypertension Mother    Healthy Father     Social History Social History   Tobacco Use   Smoking status: Every Day    Packs/day: 1.00    Types: Cigarettes   Smokeless tobacco: Never   Tobacco comments:    11/18/17 1/2 ppd  Substance Use Topics   Alcohol use: Yes    Comment: occasionally   Drug use: No     Allergies   Patient has no known allergies.   Review of Systems Review of Systems  Constitutional:  Negative for appetite change, chills and fever.  HENT:  Positive for congestion and sinus pressure. Negative for ear pain, rhinorrhea, sinus pain and sore throat.   Eyes:  Negative for redness and visual disturbance.  Respiratory:  Positive for cough. Negative for chest tightness, shortness of breath and wheezing.   Cardiovascular:  Negative for chest pain and palpitations.  Gastrointestinal:  Negative for abdominal pain, constipation, diarrhea,  nausea and vomiting.  Genitourinary:  Negative for dysuria, frequency and urgency.  Musculoskeletal:  Negative for myalgias.  Neurological:  Negative for dizziness, weakness and headaches.  Psychiatric/Behavioral:  Negative for confusion.   All other systems reviewed and are negative.   Physical Exam Triage Vital Signs ED Triage Vitals  Enc Vitals Group     BP 08/20/21 1240 126/90     Pulse Rate 08/20/21 1240 (!) 105     Resp 08/20/21 1240 18     Temp 08/20/21 1240 99.2 F (37.3 C)     Temp Source 08/20/21 1240 Oral     SpO2 08/20/21 1240 97 %     Weight --      Height --      Head Circumference --      Peak Flow --       Pain Score 08/20/21 1241 6     Pain Loc --      Pain Edu? --      Excl. in GC? --    No data found.  Updated Vital Signs BP 126/90 (BP Location: Right Arm)   Pulse (!) 105   Temp 99.2 F (37.3 C) (Oral)   Resp 18   LMP 07/30/2021 (Approximate)   SpO2 97%   Visual Acuity Right Eye Distance:   Left Eye Distance:   Bilateral Distance:    Right Eye Near:   Left Eye Near:    Bilateral Near:     Physical Exam Vitals reviewed.  Constitutional:      General: She is not in acute distress.    Appearance: Normal appearance. She is not ill-appearing.  HENT:     Head: Normocephalic and atraumatic.     Right Ear: Tympanic membrane, ear canal and external ear normal. No tenderness. No middle ear effusion. There is no impacted cerumen. Tympanic membrane is not perforated, erythematous, retracted or bulging.     Left Ear: Tympanic membrane, ear canal and external ear normal. No tenderness.  No middle ear effusion. There is no impacted cerumen. Tympanic membrane is not perforated, erythematous, retracted or bulging.     Nose: Congestion present.     Right Sinus: Maxillary sinus tenderness present.     Left Sinus: Maxillary sinus tenderness present.     Mouth/Throat:     Mouth: Mucous membranes are moist.     Pharynx: Uvula midline. No oropharyngeal exudate or posterior oropharyngeal erythema.  Eyes:     Extraocular Movements: Extraocular movements intact.     Pupils: Pupils are equal, round, and reactive to light.  Cardiovascular:     Rate and Rhythm: Normal rate and regular rhythm.     Heart sounds: Normal heart sounds.  Pulmonary:     Effort: Pulmonary effort is normal.     Breath sounds: Normal breath sounds. No decreased breath sounds, wheezing, rhonchi or rales.  Abdominal:     Palpations: Abdomen is soft.     Tenderness: There is no abdominal tenderness. There is no guarding or rebound.  Neurological:     General: No focal deficit present.     Mental Status: She is  alert and oriented to person, place, and time.  Psychiatric:        Mood and Affect: Mood normal.        Behavior: Behavior normal.        Thought Content: Thought content normal.        Judgment: Judgment normal.     UC Treatments / Results  Labs (  all labs ordered are listed, but only abnormal results are displayed) Labs Reviewed - No data to display  EKG   Radiology No results found.  Procedures Procedures (including critical care time)  Medications Ordered in UC Medications - No data to display  Initial Impression / Assessment and Plan / UC Course  I have reviewed the triage vital signs and the nursing notes.  Pertinent labs & imaging results that were available during my care of the patient were reviewed by me and considered in my medical decision making (see chart for details).     This patient is a very pleasant 30 y.o. year old female presenting with maxillary sinusitis. Borderline febrile and tachycardic.  Amoxicillin as below.  Tessalon and albuterol sent at patient request.  Recommended following up with ENT given recurrent sinusitis.  ED return precautions discussed. Patient verbalizes understanding and agreement.    Final Clinical Impressions(s) / UC Diagnoses   Final diagnoses:  Acute recurrent maxillary sinusitis     Discharge Instructions      -Amoxicillin twice daily x7 days -Tessalon (Benzonatate) as needed for cough. Take one pill up to 3x daily (every 8 hours) -Albuterol inhaler as needed for cough, wheezing, shortness of breath, 1 to 2 puffs every 6 hours as needed. -With a virus, you're typically contagious for 5-7 days, or as long as you're having fevers.     ED Prescriptions     Medication Sig Dispense Auth. Provider   amoxicillin (AMOXIL) 875 MG tablet Take 1 tablet (875 mg total) by mouth 2 (two) times daily for 7 days. 14 tablet Rhys Martini, PA-C   albuterol (VENTOLIN HFA) 108 (90 Base) MCG/ACT inhaler Inhale 1-2 puffs into  the lungs every 6 (six) hours as needed for wheezing or shortness of breath. 1 each Rhys Martini, PA-C   benzonatate (TESSALON) 100 MG capsule Take 1 capsule (100 mg total) by mouth every 8 (eight) hours. 21 capsule Rhys Martini, PA-C      PDMP not reviewed this encounter.   Rhys Martini, PA-C 08/20/21 1357

## 2021-08-20 NOTE — ED Triage Notes (Signed)
Headache, productive cough and green nasal congestion.  States face feels sore. Symptoms since Saturday.  Has been using over the counter medications without relief.

## 2021-08-22 ENCOUNTER — Other Ambulatory Visit: Payer: Self-pay | Admitting: Neurology

## 2021-08-28 ENCOUNTER — Telehealth: Payer: Self-pay | Admitting: Neurology

## 2021-08-28 NOTE — Telephone Encounter (Signed)
..   Pt understands that although there may be some limitations with this type of visit, we will take all precautions to reduce any security or privacy concerns.  Pt understands that this will be treated like an in office visit and we will file with pt's insurance, and there may be a patient responsible charge related to this service. ? ?

## 2021-08-30 ENCOUNTER — Telehealth: Payer: BC Managed Care – PPO | Admitting: Neurology

## 2021-09-03 ENCOUNTER — Telehealth (INDEPENDENT_AMBULATORY_CARE_PROVIDER_SITE_OTHER): Payer: BC Managed Care – PPO | Admitting: Adult Health

## 2021-09-03 DIAGNOSIS — R569 Unspecified convulsions: Secondary | ICD-10-CM

## 2021-09-03 NOTE — Progress Notes (Signed)
PATIENT: Allison Sandoval DOB: 1991/05/25  REASON FOR VISIT: follow up HISTORY FROM: patient  Virtual Visit via Video Note  I connected with [name] on 09/03/21 at  1:30 PM EDT by a video enabled telemedicine application located remotely at [facility] and verified that I am speaking with the correct person using two identifiers who was located at their own home.   I discussed the limitations of evaluation and management by telemedicine and the availability of in person appointments. The patient expressed understanding and agreed to proceed.   PATIENT: Allison Sandoval DOB: 06-Aug-1991  REASON FOR VISIT: follow up HISTORY FROM: patient  HISTORY OF PRESENT ILLNESS: Today 09/03/21:  Allison Sandoval is a 30 year old female with a history of seizures.  She returns today for follow-up.  Continues on Lamictal 100 mg twice a day.  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  She denies any changes with her gait or balance.  Operates a motor vehicle without difficulty.  She returns today for an evaluation.  HISTORY 08/30/2019 SS: Allison Sandoval is a 30 year old female with history of seizures.  She has not had recurrent seizure since last seen.  She continues taking Lamictal 100 mg twice a day.  She is tolerating medication well without side effect.  She works at [employer] loading trucks. She indicates overall her health has been well.  She drives a car without difficulty.  She denies any issues with her gait or balance.  She denies any new problems or concerns.  She presents today for follow-up via virtual visit.    REVIEW OF SYSTEMS: Out of a complete 14 system review of symptoms, the patient complains only of the following symptoms, and all other reviewed systems are negative.  ALLERGIES: No Known Allergies  HOME MEDICATIONS: Outpatient Medications Prior to Visit  Medication Sig Dispense Refill   albuterol (VENTOLIN HFA) 108 (90 Base)  MCG/ACT inhaler Inhale 1-2 puffs into the lungs every 6 (six) hours as needed for wheezing or shortness of breath. 1 each 0   benzonatate (TESSALON) 100 MG capsule Take 1 capsule (100 mg total) by mouth every 8 (eight) hours. 21 capsule 0   docusate sodium (COLACE) 250 MG capsule Take 1 capsule (250 mg total) by mouth daily. 10 capsule 0   lamoTRIgine (LAMICTAL) 100 MG tablet TAKE 1 TABLET BY MOUTH TWICE DAILY( PENDING APPT. 06/04/21) 180 tablet 0   Lidocaine 2 % GEL Apply 1 application topically 4 (four) times daily as needed. 28 g 0   Nitroglycerin 0.4 % OINT Place 1 application rectally in the morning and at bedtime for 7 days. 30 g 0   ORSYTHIA 0.1-20 MG-MCG tablet Take 1 tablet by mouth daily.  3   No facility-administered medications prior to visit.    PAST MEDICAL HISTORY: Past Medical History:  Diagnosis Date   Seizures (HCC)    most recent 11/15/17    PAST SURGICAL HISTORY: Past Surgical History:  Procedure Laterality Date   ADENOIDECTOMY     WISDOM TOOTH EXTRACTION      FAMILY HISTORY: Family History  Problem Relation Age of Onset   Hypertension Mother    Healthy Father     SOCIAL HISTORY: Social History   Socioeconomic History   Marital status: Single    Spouse name: Not on file   Number of children: 1   Years of education: HS   Highest education level: Not on file  Occupational  History   Occupation: Housekeeper  Tobacco Use   Smoking status: Every Day    Packs/day: 1.00    Types: Cigarettes   Smokeless tobacco: Never   Tobacco comments:    11/18/17 1/2 ppd  Substance and Sexual Activity   Alcohol use: Yes    Comment: occasionally   Drug use: No   Sexual activity: Not on file  Other Topics Concern   Not on file  Social History Narrative   Lives at home with her fiance and daughter.   Right-handed.   2 cups caffeine per day.   Social Determinants of Health   Financial Resource Strain: Not on file  Food Insecurity: Not on file  Transportation  Needs: Not on file  Physical Activity: Not on file  Stress: Not on file  Social Connections: Not on file  Intimate Partner Violence: Not on file      PHYSICAL EXAM Generalized: Well developed, in no acute distress   Neurological examination  Mentation: Alert oriented to time, place, history taking. Follows all commands speech and language fluent Cranial nerve II-XII:Extraocular movements were full. Facial symmetry noted. uvula tongue midline. Head turning and shoulder shrug  were normal and symmetric. Motor: Good strength throughout subjectively per patient Sensory: Sensory testing is intact to soft touch on all 4 extremities subjectively per patient Coordination: Cerebellar testing reveals good finger-nose-finger  Gait and station: Patient is able to stand from a seated position. gait is normal.  Reflexes: UTA  DIAGNOSTIC DATA (LABS, IMAGING, TESTING) - I reviewed patient records, labs, notes, testing and imaging myself where available.  Lab Results  Component Value Date   WBC 7.6 02/24/2020   HGB 13.7 02/24/2020   HCT 41.4 02/24/2020   MCV 94 02/24/2020   PLT 280 02/24/2020      Component Value Date/Time   NA 139 02/24/2020 1017   K 4.6 02/24/2020 1017   CL 104 02/24/2020 1017   CO2 22 02/24/2020 1017   GLUCOSE 100 (H) 02/24/2020 1017   GLUCOSE 136 (H) 12/28/2017 2358   BUN 13 02/24/2020 1017   CREATININE 0.90 02/24/2020 1017   CALCIUM 9.2 02/24/2020 1017   PROT 6.9 02/24/2020 1017   ALBUMIN 4.6 02/24/2020 1017   AST 51 (H) 02/24/2020 1017   ALT 64 (H) 02/24/2020 1017   ALKPHOS 54 02/24/2020 1017   BILITOT 0.3 02/24/2020 1017   GFRNONAA 87 02/24/2020 1017   GFRAA 100 02/24/2020 1017    ASSESSMENT AND PLAN 30 y.o. year old female  has a past medical history of Seizures (HCC). here with :  1.  Seizures  Continue Lamictal 100 mg twice a day Advised if she has any seizure event she should let us know Follow-up in 1 year or sooner if needed     Butch Penny, MSN, NP-C 09/03/2021, 1:33 PM [facility] [address], [address] [place], [place] [address] [phone]

## 2021-11-26 ENCOUNTER — Other Ambulatory Visit: Payer: Self-pay | Admitting: Neurology

## 2021-11-26 NOTE — Telephone Encounter (Signed)
Rx refilled.

## 2022-01-02 DIAGNOSIS — Z419 Encounter for procedure for purposes other than remedying health state, unspecified: Secondary | ICD-10-CM | POA: Diagnosis not present

## 2022-02-02 DIAGNOSIS — Z419 Encounter for procedure for purposes other than remedying health state, unspecified: Secondary | ICD-10-CM | POA: Diagnosis not present

## 2022-02-19 ENCOUNTER — Other Ambulatory Visit: Payer: Self-pay | Admitting: Neurology

## 2022-02-19 NOTE — Telephone Encounter (Signed)
Rx refilled.

## 2022-02-25 ENCOUNTER — Encounter (HOSPITAL_COMMUNITY): Payer: Self-pay | Admitting: Emergency Medicine

## 2022-02-25 ENCOUNTER — Emergency Department (HOSPITAL_COMMUNITY)
Admission: EM | Admit: 2022-02-25 | Discharge: 2022-02-25 | Disposition: A | Discharge status: Home / Self Care | Payer: Medicaid Other | Attending: Emergency Medicine | Admitting: Emergency Medicine

## 2022-02-25 DIAGNOSIS — D72829 Elevated white blood cell count, unspecified: Secondary | ICD-10-CM | POA: Diagnosis not present

## 2022-02-25 DIAGNOSIS — R112 Nausea with vomiting, unspecified: Secondary | ICD-10-CM

## 2022-02-25 DIAGNOSIS — R569 Unspecified convulsions: Secondary | ICD-10-CM | POA: Insufficient documentation

## 2022-02-25 DIAGNOSIS — E860 Dehydration: Secondary | ICD-10-CM | POA: Diagnosis not present

## 2022-02-25 LAB — CBC
HCT: 43.3 % (ref 36.0–46.0)
Hemoglobin: 15 g/dL (ref 12.0–15.0)
MCH: 31.8 pg (ref 26.0–34.0)
MCHC: 34.6 g/dL (ref 30.0–36.0)
MCV: 91.7 fL (ref 80.0–100.0)
Platelets: 291 10*3/uL (ref 150–400)
RBC: 4.72 MIL/uL (ref 3.87–5.11)
RDW: 12.3 % (ref 11.5–15.5)
WBC: 15.6 10*3/uL — ABNORMAL HIGH (ref 4.0–10.5)
nRBC: 0 % (ref 0.0–0.2)

## 2022-02-25 LAB — URINALYSIS, ROUTINE W REFLEX MICROSCOPIC
Bilirubin Urine: NEGATIVE
Glucose, UA: NEGATIVE mg/dL
Ketones, ur: 20 mg/dL — AB
Leukocytes,Ua: NEGATIVE
Nitrite: NEGATIVE
Protein, ur: 100 mg/dL — AB
Specific Gravity, Urine: 1.025 (ref 1.005–1.030)
pH: 5 (ref 5.0–8.0)

## 2022-02-25 LAB — COMPREHENSIVE METABOLIC PANEL
ALT: 21 U/L (ref 0–44)
AST: 21 U/L (ref 15–41)
Albumin: 4.9 g/dL (ref 3.5–5.0)
Alkaline Phosphatase: 56 U/L (ref 38–126)
Anion gap: 10 (ref 5–15)
BUN: 13 mg/dL (ref 6–20)
CO2: 23 mmol/L (ref 22–32)
Calcium: 9.6 mg/dL (ref 8.9–10.3)
Chloride: 104 mmol/L (ref 98–111)
Creatinine, Ser: 0.69 mg/dL (ref 0.44–1.00)
GFR, Estimated: >60 mL/min (ref >60)
Glucose, Bld: 104 mg/dL — ABNORMAL HIGH (ref 70–99)
Potassium: 3.7 mmol/L (ref 3.5–5.1)
Sodium: 137 mmol/L (ref 135–145)
Total Bilirubin: 0.9 mg/dL (ref 0.3–1.2)
Total Protein: 8.2 g/dL — ABNORMAL HIGH (ref 6.5–8.1)

## 2022-02-25 LAB — LIPASE, BLOOD: Lipase: 20 U/L (ref 11–51)

## 2022-02-25 LAB — POC URINE PREG, ED: Preg Test, Ur: NEGATIVE

## 2022-02-25 MED ORDER — LACTATED RINGERS IV BOLUS
1000.0000 mL | Freq: Once | INTRAVENOUS | Status: AC
  Administered 2022-02-25: 1000 mL via INTRAVENOUS

## 2022-02-25 MED ORDER — ONDANSETRON 4 MG PO TBDP: 4.0000 mg | ORAL_TABLET | Freq: Three times a day (TID) | ORAL | 0 refills | Status: AC | PRN

## 2022-02-25 MED ORDER — ONDANSETRON HCL 4 MG/2ML IJ SOLN
4.0000 mg | Freq: Once | INTRAMUSCULAR | Status: AC
  Administered 2022-02-25: 4 mg via INTRAVENOUS
  Filled 2022-02-25: qty 2

## 2022-02-25 NOTE — ED Provider Notes (Signed)
?[facility] ?Provider Note ? ? ?CSN: [id number] ?Arrival date & time: 02/25/22  1841 ? ?  ? ?History ? ?Chief Complaint  ?Patient presents with  ? Emesis  ? Seizures  ? ? ?Allison Sandoval is a 31 y.o. female. ? ? ?Emesis ?Seizures ?Patient presenting for emesis and seizures.  She reports that she was in her normal state of health this morning.  She did take her morning medications, including Lamictal which she takes for seizure prophylaxis.  At approximately 630, while driving to work, patient began to experience nausea.  While at work, patient had repeated episodes of vomiting.  Due to the persistence of vomiting, patient went home from work.  At approximately 10 AM, she did have a witnessed tonic-clonic seizure.  Patient's husband witnessed it and estimates that it lasted 3 minutes in duration.  She continued to have vomiting at home.  She did not take any antiemetic medications.  She had a second witnessed tonic-clonic seizure at around 6 PM.  Estimated duration was 1 to 2 minutes.  Since that time, patient has not had any further episodes of vomiting.  She was able to take her evening medications, including her Lamictal.  Currently, patient Dors is fatigue but denies any other symptoms.  Prior to today, patient's last seizure was 4 years ago.  2 nights ago, she did have decreased sleep due to some diarrhea which has resolved.  She denies any other recent symptoms.   ?  ? ?Home Medications ?Prior to Admission medications   ?Medication Sig Start Date End Date Taking? Authorizing Provider  ?ondansetron (ZOFRAN-ODT) 4 MG disintegrating tablet Take 1 tablet (4 mg total) by mouth every 8 (eight) hours as needed for nausea or vomiting. 02/25/22  Yes Godfrey Pick, MD  ?albuterol (VENTOLIN HFA) 108 (90 Base) MCG/ACT inhaler Inhale 1-2 puffs into the lungs every 6 (six) hours as needed for wheezing or shortness of breath. 08/20/21   Hazel Sams, PA-C  ?benzonatate (TESSALON) 100 MG capsule Take 1  capsule (100 mg total) by mouth every 8 (eight) hours. 08/20/21   Hazel Sams, PA-C  ?docusate sodium (COLACE) 250 MG capsule Take 1 capsule (250 mg total) by mouth daily. 08/03/21   Varney Biles, MD  ?lamoTRIgine (LAMICTAL) 100 MG tablet TAKE 1 TABLET BY MOUTH TWICE DAILY. APPOINTMENT REQUIRED FOR FUTURE REFILLS. 02/19/22   Marcial Pacas, MD  ?Lidocaine 2 % GEL Apply 1 application topically 4 (four) times daily as needed. 08/03/21   Varney Biles, MD  ?Nitroglycerin 0.4 % OINT Place 1 application rectally in the morning and at bedtime for 7 days. 08/03/21 08/10/21  Varney Biles, MD  ?ORSYTHIA 0.1-20 MG-MCG tablet Take 1 tablet by mouth daily. 03/30/16   [provider]  ?fluticasone (FLONASE) 50 MCG/ACT nasal spray Place 1 spray into both nostrils daily for 14 days. 05/24/20 06/15/20  Emerson Monte, FNP  ?   ? ?Allergies    ?Patient has no known allergies.   ? ?Review of Systems   ?Review of Systems  ?Gastrointestinal:  Positive for nausea and vomiting.  ?Neurological:  Positive for seizures.  ?All other systems reviewed and are negative. ? ?Physical Exam ?Updated Vital Signs ?BP 109/73   Pulse 84   Temp 97.9 ?F (36.6 ?C)   Resp 17   Ht 5\' 3"  (1.6 m)   Wt 61.7 kg   SpO2 99%   BMI 24.10 kg/m?  ?Physical Exam ?Vitals and nursing note reviewed.  ?Constitutional:   ?  General: She is not in acute distress. ?   Appearance: Normal appearance. She is well-developed and normal weight. She is not ill-appearing, toxic-appearing or diaphoretic.  ?HENT:  ?   Head: Normocephalic and atraumatic.  ?   Right Ear: External ear normal.  ?   Left Ear: External ear normal.  ?   Nose: Nose normal.  ?   Mouth/Throat:  ?   Mouth: Mucous membranes are moist.  ?   Pharynx: Oropharynx is clear.  ?   Comments: No tongue bite ?Eyes:  ?   Extraocular Movements: Extraocular movements intact.  ?   Conjunctiva/sclera: Conjunctivae normal.  ?Cardiovascular:  ?   Rate and Rhythm: Normal rate and regular rhythm.  ?   Heart  sounds: No murmur heard. ?Pulmonary:  ?   Effort: Pulmonary effort is normal. No respiratory distress.  ?   Breath sounds: Normal breath sounds. No wheezing or rales.  ?Chest:  ?   Chest wall: No tenderness.  ?Abdominal:  ?   General: There is no distension.  ?   Palpations: Abdomen is soft.  ?   Tenderness: There is no abdominal tenderness.  ?Musculoskeletal:     ?   General: No swelling. Normal range of motion.  ?   Cervical back: Normal range of motion and neck supple. No rigidity or tenderness.  ?   Right lower leg: No edema.  ?   Left lower leg: No edema.  ?Skin: ?   General: Skin is warm and dry.  ?   Capillary Refill: Capillary refill takes less than 2 seconds.  ?   Coloration: Skin is not jaundiced or pale.  ?Neurological:  ?   General: No focal deficit present.  ?   Mental Status: She is alert and oriented to person, place, and time.  ?   Cranial Nerves: No cranial nerve deficit.  ?   Sensory: No sensory deficit.  ?   Motor: No weakness.  ?   Coordination: Coordination normal.  ?Psychiatric:     ?   Mood and Affect: Mood normal.     ?   Behavior: Behavior normal.     ?   Thought Content: Thought content normal.     ?   Judgment: Judgment normal.  ? ? ?ED Results / Procedures / Treatments   ?Labs ?(all labs ordered are listed, but only abnormal results are displayed) ?Labs Reviewed  ?COMPREHENSIVE METABOLIC PANEL - Abnormal; Notable for the following components:  ?    Result Value  ? Glucose, Bld 104 (*)   ? Total Protein 8.2 (*)   ? All other components within normal limits  ?CBC - Abnormal; Notable for the following components:  ? WBC 15.6 (*)   ? All other components within normal limits  ?URINALYSIS, ROUTINE W REFLEX MICROSCOPIC - Abnormal; Notable for the following components:  ? APPearance CLOUDY (*)   ? Hgb urine dipstick SMALL (*)   ? Ketones, ur 20 (*)   ? Protein, ur 100 (*)   ? Bacteria, UA RARE (*)   ? All other components within normal limits  ?LIPASE, BLOOD  ?POC URINE PREG, ED   ? ? ?EKG ?None ? ?Radiology ?No results found. ? ?Procedures ?Procedures  ? ? ?Medications Ordered in ED ?Medications  ?lactated ringers bolus 1,000 mL (0 mLs Intravenous Stopped 02/25/22 2301)  ?ondansetron [facility]) injection 4 mg (4 mg Intravenous Given 02/25/22 2149)  ?lactated ringers bolus 1,000 mL (0 mLs Intravenous Stopped 02/25/22 2301)  ? ? ?  ED Course/ Medical Decision Making/ A&P ?  ?                        ?Medical Decision Making ?Amount and/or Complexity of Data Reviewed ?Labs: ordered. ? ?Risk ?Prescription drug management. ? ? ?This patient presents to the ED for concern of emesis and seizures, this involves an extensive number of treatment options, and is a complaint that carries with it a high risk of complications and morbidity.  The differential diagnosis includes gastroenteritis, dehydration, pregnancy, gastritis ? ? ?Co morbidities that complicate the patient evaluation ? ?Seizures ? ? ?Additional history obtained: ? ?Additional history obtained from patient's husband ?External records from outside source obtained and reviewed including EMR ? ? ?Lab Tests: ? ?I Ordered, and personally interpreted labs.  The pertinent results include: Leukocytosis is present, otherwise normal findings ? ? ?Cardiac Monitoring: / EKG: ? ?The patient was maintained on a cardiac monitor.  I personally viewed and interpreted the cardiac monitored which showed an underlying rhythm of: Sinus rhythm ? ?Problem List / ED Course / Critical interventions / Medication management ? ?Patient is a pleasant 31 year old female who presents for persistent emesis throughout the day in addition to 2 witnessed generalized seizures.  She does have a history of seizures and does take Lamictal for seizure prophylaxis.  Prior to today, her last seizure was over 4 years ago.  Prior to her seizures today, she did have onset of nausea and vomiting.  Vomiting was persistent throughout the morning and midday.  He has since subsided but patient  endorses continued nausea.  She is well-appearing on exam.  She has no focal neurologic deficits.  She denies any recent trauma.  I suspect that her seizure threshold was lowered by dehydration, caused by vomiting and p.o. intol

## 2022-02-25 NOTE — ED Notes (Signed)
Pt called out stating shes feeling nauseated . Gave emesis bag.  ?

## 2022-02-25 NOTE — ED Notes (Signed)
Pt states she takes her seizure medication at [place]. She said she vomited 2.5hr after taking first med. And hasnt thrown up since taking the second.  ?

## 2022-02-25 NOTE — ED Notes (Signed)
Ambulatory to tx room  

## 2022-02-25 NOTE — Discharge Instructions (Signed)
Zofran was sent to your pharmacy.  This is a medication to treat nausea and vomiting.  Take as needed.  Stay hydrated.  Schedule a follow-up appointment with your neurologist.  Return to the emergency department for any further concerning symptoms.  See below for seizure precautions. ? ?Per [facility] statutes, patients with seizures are not allowed to drive until  they have been seizure-free for six months. Use caution when using heavy equipment or power tools. Avoid working on ladders or at heights. Take showers instead of baths. Ensure the water temperature is not too high on the home water heater. Do not go swimming alone. When caring for infants or small children, sit down when holding, feeding, or changing them to minimize risk of injury to the child in the event you have a seizure.  ? ?Also, Maintain good sleep hygiene. Avoid alcohol.  ?

## 2022-02-25 NOTE — ED Triage Notes (Signed)
Pt c/o emesis since this morning at about 9am. Pt also c/o dizziness and 2 possible seizures as well. Pt has hx of same but hasn't been able to keep down her medication.  ?

## 2022-03-01 DIAGNOSIS — Z3041 Encounter for surveillance of contraceptive pills: Secondary | ICD-10-CM | POA: Diagnosis not present

## 2022-03-04 DIAGNOSIS — Z419 Encounter for procedure for purposes other than remedying health state, unspecified: Secondary | ICD-10-CM | POA: Diagnosis not present

## 2022-04-04 DIAGNOSIS — Z419 Encounter for procedure for purposes other than remedying health state, unspecified: Secondary | ICD-10-CM | POA: Diagnosis not present

## 2022-05-04 DIAGNOSIS — Z419 Encounter for procedure for purposes other than remedying health state, unspecified: Secondary | ICD-10-CM | POA: Diagnosis not present

## 2022-05-18 ENCOUNTER — Other Ambulatory Visit: Payer: Self-pay | Admitting: Neurology

## 2022-07-24 ENCOUNTER — Ambulatory Visit
Admission: EM | Admit: 2022-07-24 | Discharge: 2022-07-24 | Disposition: A | Discharge status: Home / Self Care | Payer: Medicaid Other | Attending: Nurse Practitioner | Admitting: Nurse Practitioner

## 2022-07-24 DIAGNOSIS — J019 Acute sinusitis, unspecified: Secondary | ICD-10-CM | POA: Diagnosis not present

## 2022-07-24 DIAGNOSIS — B9689 Other specified bacterial agents as the cause of diseases classified elsewhere: Secondary | ICD-10-CM

## 2022-07-24 MED ORDER — DOXYCYCLINE HYCLATE 100 MG PO CAPS: 100.0000 mg | ORAL_CAPSULE | Freq: Two times a day (BID) | ORAL | 0 refills | Status: AC

## 2022-07-24 MED ORDER — BENZONATATE 100 MG PO CAPS: 100.0000 mg | ORAL_CAPSULE | Freq: Three times a day (TID) | ORAL | 0 refills | Status: AC | PRN

## 2022-07-24 MED ORDER — GUAIFENESIN ER 600 MG PO TB12: 600.0000 mg | ORAL_TABLET | Freq: Two times a day (BID) | ORAL | 0 refills | Status: AC | PRN

## 2022-07-24 NOTE — Discharge Instructions (Addendum)
You have a bacterial sinus infection.  Please start on the doxycycline and take the entire course to treat this.  Please also start Mucinex 600 mg twice daily to help loosen the congestion.  You can take the cough Perles every 8 hours as needed for dry cough.  Make sure you are drinking plenty of water.  May also start nasal saline rinses to help flush out the sinus cavity.  Follow-up with [place] if your symptoms or not improved or if they worsen despite this treatment.

## 2022-07-24 NOTE — ED Provider Notes (Signed)
[facility]    CSN: [id number] Arrival date & time: 07/24/22  0843      History   Chief Complaint Chief Complaint  Patient presents with   Cough    HPI Allison Sandoval is a 31 y.o. female.   Patient presents with 2 weeks of congested, productive cough, chest and nasal congestion, postnasal drainage, sinus pressure, headache, bilateral ear pressure, and diarrhea.  She denies fever, body aches, chills, shortness of breath or wheezing, chest pain or tightness, sore throat, abdominal pain, nausea/vomiting, and decreased appetite.  Reports her energy levels are normal.  Has taken over-the-counter allergy medication without much relief.  Reports a history of sinus infections in the past.  Denies antibiotic use in the past 90 days.     Past Medical History:  Diagnosis Date   Seizures (HCC)    most recent 11/15/17    Patient Active Problem List   Diagnosis Date Noted   Seizures (HCC) 10/23/2016    Past Surgical History:  Procedure Laterality Date   ADENOIDECTOMY     WISDOM TOOTH EXTRACTION      OB History   No obstetric history on file.      Home Medications    Prior to Admission medications   Medication Sig Start Date End Date Taking? Authorizing Provider  doxycycline (VIBRAMYCIN) 100 MG capsule Take 1 capsule (100 mg total) by mouth 2 (two) times daily for 7 days. 07/24/22 07/31/22 Yes Valentino Nose, NP  guaiFENesin (MUCINEX) 600 MG 12 hr tablet Take 1 tablet (600 mg total) by mouth 2 (two) times daily as needed for cough or to loosen phlegm. 07/24/22  Yes Valentino Nose, NP  albuterol (VENTOLIN HFA) 108 (90 Base) MCG/ACT inhaler Inhale 1-2 puffs into the lungs every 6 (six) hours as needed for wheezing or shortness of breath. 08/20/21   Rhys Martini, PA-C  benzonatate (TESSALON) 100 MG capsule Take 1 capsule (100 mg total) by mouth 3 (three) times daily as needed for cough. Do not take with alcohol or while driving or operating heavy machinery  07/24/22  Yes Cathlean Marseilles A, NP  docusate sodium (COLACE) 250 MG capsule Take 1 capsule (250 mg total) by mouth daily. 08/03/21   Derwood Kaplan, MD  lamoTRIgine (LAMICTAL) 100 MG tablet TAKE 1 TABLET BY MOUTH TWICE DAILY. APPOINTMENT REQUIRED FOR FUTURE REFILLS. 05/20/22   Levert Feinstein, MD  Lidocaine 2 % GEL Apply 1 application topically 4 (four) times daily as needed. 08/03/21   Derwood Kaplan, MD  Nitroglycerin 0.4 % OINT Place 1 application rectally in the morning and at bedtime for 7 days. 08/03/21 08/10/21  Derwood Kaplan, MD  ondansetron (ZOFRAN-ODT) 4 MG disintegrating tablet Take 1 tablet (4 mg total) by mouth every 8 (eight) hours as needed for nausea or vomiting. 02/25/22   Gloris Manchester, MD  ORSYTHIA 0.1-20 MG-MCG tablet Take 1 tablet by mouth daily. 03/30/16   [provider]  fluticasone (FLONASE) 50 MCG/ACT nasal spray Place 1 spray into both nostrils daily for 14 days. 05/24/20 06/15/20  Durward Parcel, FNP    Family History Family History  Problem Relation Age of Onset   Hypertension Mother    Healthy Father     Social History Social History   Tobacco Use   Smoking status: Every Day    Packs/day: 1.00    Types: Cigarettes   Smokeless tobacco: Never   Tobacco comments:    11/18/17 1/2 ppd  Substance Use Topics   Alcohol  use: Yes    Comment: occasionally   Drug use: No     Allergies   Patient has no known allergies.   Review of Systems Review of Systems Per HPI  Physical Exam Triage Vital Signs ED Triage Vitals  Enc Vitals Group     BP 07/24/22 1001 118/83     Pulse Rate 07/24/22 1001 98     Resp 07/24/22 1001 18     Temp 07/24/22 1001 98 F (36.7 C)     Temp Source 07/24/22 1001 Oral     SpO2 07/24/22 1001 99 %     Weight --      Height --      Head Circumference --      Peak Flow --      Pain Score 07/24/22 1000 0     Pain Loc --      Pain Edu? --      Excl. in GC? --    No data found.  Updated Vital Signs BP 118/83 (BP  Location: Right Arm)   Pulse 98   Temp 98 F (36.7 C) (Oral)   Resp 18   SpO2 99%   Visual Acuity Right Eye Distance:   Left Eye Distance:   Bilateral Distance:    Right Eye Near:   Left Eye Near:    Bilateral Near:     Physical Exam Vitals and nursing note reviewed.  Constitutional:      General: She is not in acute distress.    Appearance: Normal appearance. She is not ill-appearing or toxic-appearing.  HENT:     Head: Normocephalic and atraumatic.     Right Ear: Tympanic membrane, ear canal and external ear normal.     Left Ear: Tympanic membrane, ear canal and external ear normal.     Nose: Congestion and rhinorrhea present.     Right Sinus: Maxillary sinus tenderness present. No frontal sinus tenderness.     Left Sinus: Maxillary sinus tenderness present. No frontal sinus tenderness.     Mouth/Throat:     Mouth: Mucous membranes are moist.     Pharynx: Oropharynx is clear. Posterior oropharyngeal erythema present. No oropharyngeal exudate.  Eyes:     General: No scleral icterus.    Extraocular Movements: Extraocular movements intact.  Cardiovascular:     Rate and Rhythm: Normal rate and regular rhythm.  Pulmonary:     Effort: Pulmonary effort is normal. No respiratory distress.     Breath sounds: Normal breath sounds. No wheezing, rhonchi or rales.  Abdominal:     General: Abdomen is flat. Bowel sounds are normal. There is no distension.     Palpations: Abdomen is soft.  Musculoskeletal:     Cervical back: Normal range of motion and neck supple.  Lymphadenopathy:     Cervical: No cervical adenopathy.  Skin:    General: Skin is warm and dry.     Capillary Refill: Capillary refill takes less than 2 seconds.     Coloration: Skin is not jaundiced or pale.     Findings: No erythema or rash.  Neurological:     Mental Status: She is alert and oriented to person, place, and time.  Psychiatric:        Behavior: Behavior is cooperative.      UC Treatments /  Results  Labs (all labs ordered are listed, but only abnormal results are displayed) Labs Reviewed - No data to display  EKG   Radiology No results found.  Procedures  Procedures (including critical care time)  Medications Ordered in UC Medications - No data to display  Initial Impression / Assessment and Plan / UC Course  I have reviewed the triage vital signs and the nursing notes.  Pertinent labs & imaging results that were available during my care of the patient were reviewed by me and considered in my medical decision making (see chart for details).    Patient is well-appearing, normotensive, afebrile, not tachycardic, not tachypneic, oxygenating well on room air.   Treat bacterial sinus infection with doxycycline twice daily for 7 days.  Also encouraged guaifenesin 600 mg twice daily to help loosen cough.  Push hydration.  Start cough suppressant.  Other supportive care discussed with over-the-counter treatment.  Note given for work.  Return and ER precautions discussed.  The patient was given the opportunity to ask questions.  All questions answered to their satisfaction.  The patient is in agreement to this plan.   Final Clinical Impressions(s) / UC Diagnoses   Final diagnoses:  Acute bacterial sinusitis     Discharge Instructions      You have a bacterial sinus infection.  Please start on the doxycycline and take the entire course to treat this.  Please also start Mucinex 600 mg twice daily to help loosen the congestion.  You can take the cough Perles every 8 hours as needed for dry cough.  Make sure you are drinking plenty of water.  May also start nasal saline rinses to help flush out the sinus cavity.  Follow-up with [place] if your symptoms or not improved or if they worsen despite this treatment.     ED Prescriptions     Medication Sig Dispense Auth. Provider   doxycycline (VIBRAMYCIN) 100 MG capsule Take 1 capsule (100 mg total) by mouth 2 (two) times daily for 7  days. 14 capsule Noemi Chapel A, NP   guaiFENesin (MUCINEX) 600 MG 12 hr tablet Take 1 tablet (600 mg total) by mouth 2 (two) times daily as needed for cough or to loosen phlegm. 30 tablet Noemi Chapel A, NP   benzonatate (TESSALON) 100 MG capsule Take 1 capsule (100 mg total) by mouth 3 (three) times daily as needed for cough. Do not take with alcohol or while driving or operating heavy machinery 21 capsule Eulogio Bear, NP      PDMP not reviewed this encounter.   Eulogio Bear, NP 07/24/22 1113

## 2022-07-24 NOTE — ED Triage Notes (Signed)
Pt reports cough, chest congestion and nasal congestion x 2 weeks. Allergies meds gives no relief.   Pt needs works notes.

## 2022-08-16 ENCOUNTER — Other Ambulatory Visit: Payer: Self-pay | Admitting: Neurology

## 2022-08-28 ENCOUNTER — Telehealth: Payer: Self-pay | Admitting: Adult Health

## 2022-08-28 NOTE — Telephone Encounter (Signed)
LVM and sent mychart msg informing pt of need to reschedule 10/31 appointment - NP out 

## 2022-09-03 ENCOUNTER — Telehealth: Payer: BC Managed Care – PPO | Admitting: Adult Health

## 2022-11-06 ENCOUNTER — Other Ambulatory Visit: Payer: Self-pay | Admitting: Adult Health

## 2023-02-17 ENCOUNTER — Other Ambulatory Visit: Payer: Self-pay | Admitting: Adult Health

## 2023-02-20 ENCOUNTER — Encounter: Payer: Self-pay | Admitting: *Deleted

## 2023-02-20 NOTE — Telephone Encounter (Signed)
Called pt and informed her. Pt was very appreciative.  

## 2023-02-20 NOTE — Telephone Encounter (Signed)
Pt called and scheduled appt with Np to follow up on her medication. Pt stated that she only has enough to last her till Monday.

## 2023-02-25 ENCOUNTER — Telehealth (INDEPENDENT_AMBULATORY_CARE_PROVIDER_SITE_OTHER): Payer: BC Managed Care – PPO | Admitting: Adult Health

## 2023-02-25 DIAGNOSIS — R569 Unspecified convulsions: Secondary | ICD-10-CM | POA: Diagnosis not present

## 2023-02-25 DIAGNOSIS — Z5181 Encounter for therapeutic drug level monitoring: Secondary | ICD-10-CM

## 2023-02-25 MED ORDER — LAMOTRIGINE 100 MG PO TABS: ORAL_TABLET | ORAL | 3 refills | Status: DC

## 2023-02-25 NOTE — Progress Notes (Signed)
PATIENT: Allison Sandoval DOB: 1991/08/08  REASON FOR VISIT: follow up HISTORY FROM: patient  Virtual Visit via Video Note  I connected with Allison Sandoval on 02/25/23 at  3:00 PM EDT by a video enabled telemedicine application located remotely at [facility] and verified that I am speaking with the correct person using two identifiers who was located at their own home.   I discussed the limitations of evaluation and management by telemedicine and the availability of in person appointments. The patient expressed understanding and agreed to proceed.   PATIENT: Allison Sandoval DOB: September 15, 1991  REASON FOR VISIT: follow up HISTORY FROM: patient  HISTORY OF PRESENT ILLNESS: Today 02/25/23:  Allison Sandoval is a 32 y.o. female with a history of seizures. Returns today for follow-up.  Denies any seizure events.  Continues on Lamictal 100 mg twice a day.  Reports that she tolerates the medication well no changes in her mood or behavior.  No change in gait or balance.  She returns today for an evaluation.     09/03/21: Allison Sandoval is a 32 year old female with a history of seizures.  She returns today for follow-up.  Continues on Lamictal 100 mg twice a day.  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  She denies any changes with her gait or balance.  Operates a motor vehicle without difficulty.  She returns today for an evaluation.  HISTORY 08/30/2019 SS: Allison Sandoval is a 32 year old female with history of seizures.  She has not had recurrent seizure since last seen.  She continues taking Lamictal 100 mg twice a day.  She is tolerating medication well without side effect.  She works at [employer] loading trucks. She indicates overall her health has been well.  She drives a car without difficulty.  She denies any issues with her gait or balance.  She denies any new problems or concerns.  She presents today for follow-up via virtual visit.     REVIEW OF SYSTEMS: Out of a complete 14 system review of symptoms, the patient complains only of the following symptoms, and all other reviewed systems are negative.  ALLERGIES: No Known Allergies  HOME MEDICATIONS: Outpatient Medications Prior to Visit  Medication Sig Dispense Refill   albuterol (VENTOLIN HFA) 108 (90 Base) MCG/ACT inhaler Inhale 1-2 puffs into the lungs every 6 (six) hours as needed for wheezing or shortness of breath. 1 each 0   benzonatate (TESSALON) 100 MG capsule Take 1 capsule (100 mg total) by mouth 3 (three) times daily as needed for cough. Do not take with alcohol or while driving or operating heavy machinery 21 capsule 0   docusate sodium (COLACE) 250 MG capsule Take 1 capsule (250 mg total) by mouth daily. 10 capsule 0   guaiFENesin (MUCINEX) 600 MG 12 hr tablet Take 1 tablet (600 mg total) by mouth 2 (two) times daily as needed for cough or to loosen phlegm. 30 tablet 0   lamoTRIgine (LAMICTAL) 100 MG tablet TAKE 1 TABLET BY MOUTH TWICE DAILY. PT NEEDS AN APPOINTMENT FOR FUTURE REFILL REQUEST. 60 tablet 0   Lidocaine 2 % GEL Apply 1 application topically 4 (four) times daily as needed. 28 g 0   Nitroglycerin 0.4 % OINT Place 1 application rectally in the morning and at bedtime for 7 days. 30 g 0   ondansetron (ZOFRAN-ODT) 4 MG disintegrating tablet Take 1 tablet (4 mg total) by mouth every 8 (eight) hours  as needed for nausea or vomiting. 20 tablet 0   ORSYTHIA 0.1-20 MG-MCG tablet Take 1 tablet by mouth daily.  3   No facility-administered medications prior to visit.    PAST MEDICAL HISTORY: Past Medical History:  Diagnosis Date   Seizures (HCC)    most recent 11/15/17    PAST SURGICAL HISTORY: Past Surgical History:  Procedure Laterality Date   ADENOIDECTOMY     WISDOM TOOTH EXTRACTION      FAMILY HISTORY: Family History  Problem Relation Age of Onset   Hypertension Mother    Healthy Father     SOCIAL HISTORY: Social History    Socioeconomic History   Marital status: Single    Spouse name: Not on file   Number of children: 1   Years of education: HS   Highest education level: Not on file  Occupational History   Occupation: Housekeeper  Tobacco Use   Smoking status: Every Day    Packs/day: 1    Types: Cigarettes   Smokeless tobacco: Never   Tobacco comments:    11/18/17 1/2 ppd  Substance and Sexual Activity   Alcohol use: Yes    Comment: occasionally   Drug use: No   Sexual activity: Not on file  Other Topics Concern   Not on file  Social History Narrative   Lives at home with her fiance and daughter.   Right-handed.   2 cups caffeine per day.   Social Determinants of Health   Financial Resource Strain: Not on file  Food Insecurity: Not on file  Transportation Needs: Not on file  Physical Activity: Not on file  Stress: Not on file  Social Connections: Not on file  Intimate Partner Violence: Not on file      PHYSICAL EXAM Generalized: Well developed, in no acute distress   Neurological examination  Mentation: Alert oriented to time, place, history taking. Follows all commands speech and language fluent Cranial nerve II-XII: Facial symmetry noted DIAGNOSTIC DATA (LABS, IMAGING, TESTING) - I reviewed patient records, labs, notes, testing and imaging myself where available.  Lab Results  Component Value Date   WBC 15.6 (H) 02/25/2022   HGB 15.0 02/25/2022   HCT 43.3 02/25/2022   MCV 91.7 02/25/2022   PLT 291 02/25/2022      Component Value Date/Time   NA 137 02/25/2022 2101   NA 139 02/24/2020 1017   K 3.7 02/25/2022 2101   CL 104 02/25/2022 2101   CO2 23 02/25/2022 2101   GLUCOSE 104 (H) 02/25/2022 2101   BUN 13 02/25/2022 2101   BUN 13 02/24/2020 1017   CREATININE 0.69 02/25/2022 2101   CALCIUM 9.6 02/25/2022 2101   PROT 8.2 (H) 02/25/2022 2101   PROT 6.9 02/24/2020 1017   ALBUMIN 4.9 02/25/2022 2101   ALBUMIN 4.6 02/24/2020 1017   AST 21 02/25/2022 2101   ALT 21  02/25/2022 2101   ALKPHOS 56 02/25/2022 2101   BILITOT 0.9 02/25/2022 2101   BILITOT 0.3 02/24/2020 1017   GFRNONAA >60 02/25/2022 2101   GFRAA 100 02/24/2020 1017    ASSESSMENT AND PLAN 32 y.o. year old female  has a past medical history of Seizures (HCC). here with :  1.  Seizures  Continue Lamictal 100 mg twice a day Advised if she has any seizure event she should let us know Blood work has been ordered for CBC, CMP and Lamictal level Follow-up in 1 year or sooner if needed     Butch Penny, MSN, NP-C 02/25/2023,  2:53 PM [facility] [address], [address] [place], [place] [address] [phone]

## 2023-03-06 ENCOUNTER — Other Ambulatory Visit: Payer: Medicaid Other

## 2023-03-06 DIAGNOSIS — Z0289 Encounter for other administrative examinations: Secondary | ICD-10-CM

## 2023-03-06 DIAGNOSIS — Z5181 Encounter for therapeutic drug level monitoring: Secondary | ICD-10-CM

## 2023-03-07 LAB — COMPREHENSIVE METABOLIC PANEL
ALT: 18 IU/L (ref 0–32)
AST: 14 IU/L (ref 0–40)
Albumin/Globulin Ratio: 2 (ref 1.2–2.2)
Albumin: 4.7 g/dL (ref 3.9–4.9)
Alkaline Phosphatase: 52 IU/L (ref 44–121)
BUN/Creatinine Ratio: 16 (ref 9–23)
BUN: 13 mg/dL (ref 6–20)
Bilirubin Total: <0.2 mg/dL (ref 0.0–1.2)
CO2: 19 mmol/L — ABNORMAL LOW (ref 20–29)
Calcium: 9.9 mg/dL (ref 8.7–10.2)
Chloride: 103 mmol/L (ref 96–106)
Creatinine, Ser: 0.8 mg/dL (ref 0.57–1.00)
Globulin, Total: 2.3 g/dL (ref 1.5–4.5)
Glucose: 84 mg/dL (ref 70–99)
Potassium: 4.1 mmol/L (ref 3.5–5.2)
Sodium: 140 mmol/L (ref 134–144)
Total Protein: 7 g/dL (ref 6.0–8.5)
eGFR: 100 mL/min/{1.73_m2} (ref >59)

## 2023-03-07 LAB — CBC WITH DIFFERENTIAL/PLATELET
Basophils Absolute: 0.1 10*3/uL (ref 0.0–0.2)
Basos: 1 %
EOS (ABSOLUTE): 0.4 10*3/uL (ref 0.0–0.4)
Eos: 3 %
Hematocrit: 40.9 % (ref 34.0–46.6)
Hemoglobin: 13.9 g/dL (ref 11.1–15.9)
Immature Grans (Abs): 0 10*3/uL (ref 0.0–0.1)
Immature Granulocytes: 0 %
Lymphocytes Absolute: 3.3 10*3/uL — ABNORMAL HIGH (ref 0.7–3.1)
Lymphs: 32 %
MCH: 31.1 pg (ref 26.6–33.0)
MCHC: 34 g/dL (ref 31.5–35.7)
MCV: 92 fL (ref 79–97)
Monocytes Absolute: 0.6 10*3/uL (ref 0.1–0.9)
Monocytes: 6 %
Neutrophils Absolute: 5.9 10*3/uL (ref 1.4–7.0)
Neutrophils: 58 %
Platelets: 275 10*3/uL (ref 150–450)
RBC: 4.47 x10E6/uL (ref 3.77–5.28)
RDW: 11.9 % (ref 11.7–15.4)
WBC: 10.2 10*3/uL (ref 3.4–10.8)

## 2023-03-07 LAB — LAMOTRIGINE LEVEL: Lamotrigine Lvl: 2.1 ug/mL (ref 2.0–20.0)

## 2023-11-12 ENCOUNTER — Telehealth: Payer: Self-pay | Admitting: Adult Health

## 2023-11-12 NOTE — Telephone Encounter (Signed)
 I called the patient and left a voicemail asking her to give me a call back.  This is in regards to a notification from [facility] that she is on birth control medicine that will lower the concentration of lamotrigine . If she is taking this medication we will need to check drug levels.  Message from [facility]: Adverse Drug Interaction: LESSINA and LAMOTRIGINE  Our claims record suggests that your patient is receiving LESSINA and LAMOTRIGINE . Use of estrogen-containing oral contraceptives may significantly decrease lamotrigine  concentrations. The lamotrigine  dose may need to be increased by as much as two-fold according to clinical response during concomitant therapy. Please consider a review of your patient's medication regimen to determine if changes in therapy are warranted.

## 2023-11-12 NOTE — Telephone Encounter (Signed)
 I called the patient.  She states that she was started on this medication however she has been on estrogen contraceptive since she has been on Lamictal  per patient.  Denies any seizure-like events.  She will see her primary care provider tomorrow.  I advised that they could check a Lamictal  level and we can compare it to previous levels.  Patient voiced understanding.

## 2023-11-12 NOTE — Telephone Encounter (Signed)
 Pt LVM at 10:41 am returning phone call would like a call back.

## 2023-12-15 ENCOUNTER — Other Ambulatory Visit: Payer: Self-pay | Admitting: Adult Health

## 2023-12-15 MED ORDER — LAMOTRIGINE 100 MG PO TABS: ORAL_TABLET | ORAL | 0 refills | Status: DC

## 2023-12-15 NOTE — Telephone Encounter (Signed)
 This pt has an appt in 02/2024.  Last seen 02/2023 and had labs checked last 03/2023.

## 2023-12-15 NOTE — Telephone Encounter (Signed)
 Pt is requesting a refill for lamoTRIgine  (LAMICTAL ) 100 MG tablet.  Pharmacy: [facility] [phone]

## 2024-02-24 ENCOUNTER — Telehealth: Payer: BC Managed Care – PPO | Admitting: Adult Health

## 2024-02-25 ENCOUNTER — Encounter: Payer: Self-pay | Admitting: Adult Health

## 2024-02-25 MED ORDER — LAMOTRIGINE 100 MG PO TABS: ORAL_TABLET | ORAL | 0 refills | Status: DC

## 2024-05-13 ENCOUNTER — Telehealth: Admitting: Adult Health

## 2024-05-13 DIAGNOSIS — R569 Unspecified convulsions: Secondary | ICD-10-CM | POA: Diagnosis not present

## 2024-05-13 MED ORDER — LAMOTRIGINE 100 MG PO TABS: 100.0000 mg | ORAL_TABLET | Freq: Two times a day (BID) | ORAL | 3 refills | Status: AC

## 2024-05-13 NOTE — Progress Notes (Signed)
 PATIENT: Allison Sandoval DOB: 02/23/1991  REASON FOR VISIT: follow up HISTORY FROM: patient  Virtual Visit via Video Note  I connected with Allison Sandoval on 05/13/24 at 11:45 AM EDT by a video enabled telemedicine application located remotely at [facility] and verified that I am speaking with the correct person using two identifiers who was located at their own home in [place].    I discussed the limitations of evaluation and management by telemedicine and the availability of in person appointments. The patient expressed understanding and agreed to proceed.   PATIENT: Allison Sandoval DOB: [birth year]  REASON FOR VISIT: follow up HISTORY FROM: patient  HISTORY OF PRESENT ILLNESS: Today 05/13/24:  Allison Sandoval is a 33 y.o. female with a history of seizures. Returns today for follow-up.  Overall she has been doing well.  Denies any seizure events.  Remains on Lamictal  100 mg twice a day.  No change in her gait or balance.  No change in mood or behavior.  She states that she follows up with her PCP monthly due to being on Xanax.  She returns today for an evaluation.   02/25/23: Allison Sandoval is a 33 y.o. female with a history of seizures. Returns today for follow-up.  Denies any seizure events.  Continues on Lamictal  100 mg twice a day.  Reports that she tolerates the medication well no changes in her mood or behavior.  No change in gait or balance.  She returns today for an evaluation.   09/03/21: Allison Sandoval is a 33 year old female with a history of seizures.  She returns today for follow-up.  Continues on Lamictal  100 mg twice a day.  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  She denies any changes with her gait or balance.  Operates a motor vehicle without difficulty.  She returns today for an evaluation.  HISTORY 08/30/2019 SS: Allison Sandoval is a 33 year old female with history of seizures.  She has not had recurrent  seizure since last seen.  She continues taking Lamictal  100 mg twice a day.  She is tolerating medication well without side effect.  She works at [employer] loading trucks. She indicates overall her health has been well.  She drives a car without difficulty.  She denies any issues with her gait or balance.  She denies any new problems or concerns.  She presents today for follow-up via virtual visit.    REVIEW OF SYSTEMS: Out of a complete 14 system review of symptoms, the patient complains only of the following symptoms, and all other reviewed systems are negative.  ALLERGIES: No Known Allergies  HOME MEDICATIONS: Outpatient Medications Prior to Visit  Medication Sig Dispense Refill   albuterol  (VENTOLIN  HFA) 108 (90 Base) MCG/ACT inhaler Inhale 1-2 puffs into the lungs every 6 (six) hours as needed for wheezing or shortness of breath. 1 each 0   benzonatate  (TESSALON ) 100 MG capsule Take 1 capsule (100 mg total) by mouth 3 (three) times daily as needed for cough. Do not take with alcohol or while driving or operating heavy machinery 21 capsule 0   docusate sodium  (COLACE) 250 MG capsule Take 1 capsule (250 mg total) by mouth daily. 10 capsule 0   guaiFENesin  (MUCINEX ) 600 MG 12 hr tablet Take 1 tablet (600 mg total) by mouth 2 (two) times daily as needed for cough or to loosen phlegm. 30 tablet 0   lamoTRIgine  (LAMICTAL ) 100 MG tablet  TAKE 1 TABLET BY MOUTH TWICE DAILY. 180 tablet 0   Lidocaine  2 % GEL Apply 1 application topically 4 (four) times daily as needed. 28 g 0   Nitroglycerin  0.4 % OINT Place 1 application rectally in the morning and at bedtime for 7 days. 30 g 0   ondansetron  (ZOFRAN -ODT) 4 MG disintegrating tablet Take 1 tablet (4 mg total) by mouth every 8 (eight) hours as needed for nausea or vomiting. 20 tablet 0   ORSYTHIA 0.1-20 MG-MCG tablet Take 1 tablet by mouth daily.  3   No facility-administered medications prior to visit.    PAST MEDICAL HISTORY: Past Medical History:   Diagnosis Date   Seizures (HCC)    most recent 11/15/17    PAST SURGICAL HISTORY: Past Surgical History:  Procedure Laterality Date   ADENOIDECTOMY     WISDOM TOOTH EXTRACTION      FAMILY HISTORY: Family History  Problem Relation Age of Onset   Hypertension Mother    Healthy Father     SOCIAL HISTORY: Social History   Socioeconomic History   Marital status: Single    Spouse name: Not on file   Number of children: 1   Years of education: HS   Highest education level: Not on file  Occupational History   Occupation: Housekeeper  Tobacco Use   Smoking status: Every Day    Current packs/day: 1.00    Types: Cigarettes   Smokeless tobacco: Never   Tobacco comments:    11/18/17 1/2 ppd  Substance and Sexual Activity   Alcohol use: Yes    Comment: occasionally   Drug use: No   Sexual activity: Not on file  Other Topics Concern   Not on file  Social History Narrative   Lives at home with her fiance and daughter.   Right-handed.   2 cups caffeine per day.   Social Drivers of Corporate investment banker Strain: Not on file  Food Insecurity: Not on file  Transportation Needs: Not on file  Physical Activity: Not on file  Stress: Not on file  Social Connections: Not on file  Intimate Partner Violence: Not on file      PHYSICAL EXAM Generalized: Well developed, in no acute distress   Neurological examination  Mentation: Alert oriented to time, place, history taking. Follows all commands speech and language fluent Cranial nerve II-XII: Facial symmetry noted DIAGNOSTIC DATA (LABS, IMAGING, TESTING) - I reviewed patient records, labs, notes, testing and imaging myself where available.  Lab Results  Component Value Date   WBC 10.2 03/06/2023   HGB 13.9 03/06/2023   HCT 40.9 03/06/2023   MCV 92 03/06/2023   PLT 275 03/06/2023      Component Value Date/Time   NA 140 03/06/2023 1453   K 4.1 03/06/2023 1453   CL 103 03/06/2023 1453   CO2 19 (L) 03/06/2023  1453   GLUCOSE 84 03/06/2023 1453   GLUCOSE 104 (H) 02/25/2022 2101   BUN 13 03/06/2023 1453   CREATININE 0.80 03/06/2023 1453   CALCIUM 9.9 03/06/2023 1453   PROT 7.0 03/06/2023 1453   ALBUMIN 4.7 03/06/2023 1453   AST 14 03/06/2023 1453   ALT 18 03/06/2023 1453   ALKPHOS 52 03/06/2023 1453   BILITOT <0.2 03/06/2023 1453   GFRNONAA >60 02/25/2022 2101   GFRAA 100 02/24/2020 1017    ASSESSMENT AND PLAN 33 y.o. year old female  has a past medical history of Seizures (HCC). here with :  1.  Seizures  Continue Lamictal  100 mg twice a day Advised if she has any seizure event she should let us  know Since her PCP is drawing blood work monthly she will ask them for Lamictal  level next month Follow-up in 1 year or sooner if needed in office     Allison Russell, MSN, NP-C 05/13/2024, 11:32 AM [facility] [address], [address] [place], [place] [address] [phone]  The patient's condition requires frequent monitoring and adjustments in the treatment plan, reflecting the ongoing complexity of care.  This provider is the continuing focal point for all needed services for this condition.

## 2024-05-13 NOTE — Patient Instructions (Signed)
 Your Plan:  Continue Lamictal  100 mg twice a day Have PCP check lamotrigine  level If your symptoms worsen or you develop new symptoms please let us  know.       Thank you for coming to see us  at [facility]. I hope we have been able to provide you high quality care today.  You may receive a patient satisfaction survey over the next few weeks. We would appreciate your feedback and comments so that we may continue to improve ourselves and the health of our patients.

## 2025-05-12 ENCOUNTER — Ambulatory Visit: Admitting: Adult Health
